# Patient Record
Sex: Male | Born: 1965
Health system: Southern US, Community
[De-identification: ages and names within clinical notes are randomized; demographics above are authoritative.]

## PROBLEM LIST (undated history)

## (undated) DIAGNOSIS — R001 Bradycardia, unspecified: Secondary | ICD-10-CM

## (undated) HISTORY — PX: TONSILLECTOMY: SUR1361

---

## 2017-02-08 ENCOUNTER — Ambulatory Visit (HOSPITAL_COMMUNITY)
Admission: EM | Admit: 2017-02-08 | Discharge: 2017-02-09 | Disposition: A | Discharge status: Home / Self Care | Payer: BLUE CROSS/BLUE SHIELD | Attending: Internal Medicine | Admitting: Internal Medicine

## 2017-02-08 ENCOUNTER — Emergency Department (HOSPITAL_COMMUNITY): Payer: BLUE CROSS/BLUE SHIELD

## 2017-02-08 ENCOUNTER — Emergency Department (HOSPITAL_BASED_OUTPATIENT_CLINIC_OR_DEPARTMENT_OTHER): Payer: BLUE CROSS/BLUE SHIELD

## 2017-02-08 ENCOUNTER — Encounter (HOSPITAL_COMMUNITY): Payer: Self-pay | Admitting: Emergency Medicine

## 2017-02-08 ENCOUNTER — Encounter (HOSPITAL_COMMUNITY)
Admission: EM | Disposition: A | Discharge status: Home / Self Care | Payer: Self-pay | Source: Home / Self Care | Attending: Emergency Medicine

## 2017-02-08 DIAGNOSIS — R32 Unspecified urinary incontinence: Secondary | ICD-10-CM | POA: Insufficient documentation

## 2017-02-08 DIAGNOSIS — R55 Syncope and collapse: Secondary | ICD-10-CM

## 2017-02-08 DIAGNOSIS — I495 Sick sinus syndrome: Secondary | ICD-10-CM | POA: Diagnosis present

## 2017-02-08 DIAGNOSIS — R251 Tremor, unspecified: Secondary | ICD-10-CM | POA: Insufficient documentation

## 2017-02-08 DIAGNOSIS — Z959 Presence of cardiac and vascular implant and graft, unspecified: Secondary | ICD-10-CM

## 2017-02-08 DIAGNOSIS — Z23 Encounter for immunization: Secondary | ICD-10-CM | POA: Diagnosis not present

## 2017-02-08 DIAGNOSIS — R001 Bradycardia, unspecified: Secondary | ICD-10-CM | POA: Diagnosis not present

## 2017-02-08 HISTORY — DX: Bradycardia, unspecified: R00.1

## 2017-02-08 HISTORY — PX: INSERT / REPLACE / REMOVE PACEMAKER: SUR710

## 2017-02-08 HISTORY — PX: PACEMAKER IMPLANT: EP1218

## 2017-02-08 LAB — ECHOCARDIOGRAM COMPLETE
Ao-asc: 31 cm
CHL CUP TV REG PEAK VELOCITY: 205 cm/s
E decel time: 236 msec
EERAT: 6.99
FS: 28 % (ref 28–44)
Height: 69.5 in
IVS/LV PW RATIO, ED: 0.89
LA ID, A-P, ES: 35 mm
LA diam end sys: 35 mm
LA diam index: 1.74 cm/m2
LA vol index: 21.2 mL/m2
LAVOL: 42.7 mL
LAVOLA4C: 39.2 mL
LDCA: 3.8 cm2
LV E/e'average: 6.99
LV PW d: 11.3 mm — AB (ref 0.6–1.1)
LV e' LATERAL: 9.57 cm/s
LVEEMED: 6.99
LVOT diameter: 22 mm
MV Dec: 236
MVPKAVEL: 60.6 m/s
MVPKEVEL: 66.9 m/s
RV LATERAL S' VELOCITY: 20.4 cm/s
RV TAPSE: 21.2 mm
RV sys press: 20 mmHg
TDI e' lateral: 9.57
TDI e' medial: 9.46
TRMAXVEL: 205 cm/s
WEIGHTICAEL: 2880 [oz_av]

## 2017-02-08 LAB — BASIC METABOLIC PANEL
Anion gap: 9 (ref 5–15)
BUN: 8 mg/dL (ref 6–20)
CHLORIDE: 101 mmol/L (ref 101–111)
CO2: 25 mmol/L (ref 22–32)
Calcium: 8.8 mg/dL — ABNORMAL LOW (ref 8.9–10.3)
Creatinine, Ser: 0.85 mg/dL (ref 0.61–1.24)
GFR calc Af Amer: >60 mL/min (ref >60)
GFR calc non Af Amer: >60 mL/min (ref >60)
GLUCOSE: 106 mg/dL — AB (ref 65–99)
POTASSIUM: 4.5 mmol/L (ref 3.5–5.1)
SODIUM: 135 mmol/L (ref 135–145)

## 2017-02-08 LAB — SURGICAL PCR SCREEN
MRSA, PCR: NEGATIVE
STAPHYLOCOCCUS AUREUS: POSITIVE — AB

## 2017-02-08 LAB — URINALYSIS, ROUTINE W REFLEX MICROSCOPIC
Bilirubin Urine: NEGATIVE
GLUCOSE, UA: NEGATIVE mg/dL
Hgb urine dipstick: NEGATIVE
KETONES UR: NEGATIVE mg/dL
LEUKOCYTES UA: NEGATIVE
Nitrite: NEGATIVE
PH: 7 (ref 5.0–8.0)
Protein, ur: NEGATIVE mg/dL
SPECIFIC GRAVITY, URINE: 1.011 (ref 1.005–1.030)

## 2017-02-08 LAB — CBC
HEMATOCRIT: 45.4 % (ref 39.0–52.0)
HEMOGLOBIN: 14.8 g/dL (ref 13.0–17.0)
MCH: 30.8 pg (ref 26.0–34.0)
MCHC: 32.6 g/dL (ref 30.0–36.0)
MCV: 94.6 fL (ref 78.0–100.0)
Platelets: 305 10*3/uL (ref 150–400)
RBC: 4.8 MIL/uL (ref 4.22–5.81)
RDW: 13.4 % (ref 11.5–15.5)
WBC: 10.1 10*3/uL (ref 4.0–10.5)

## 2017-02-08 LAB — CBG MONITORING, ED
Glucose-Capillary: 100 mg/dL — ABNORMAL HIGH (ref 65–99)
Glucose-Capillary: 115 mg/dL — ABNORMAL HIGH (ref 65–99)

## 2017-02-08 SURGERY — PACEMAKER IMPLANT

## 2017-02-08 MED ORDER — INFLUENZA VAC SPLIT QUAD 0.5 ML IM SUSY
0.5000 mL | PREFILLED_SYRINGE | INTRAMUSCULAR | Status: AC
  Administered 2017-02-09: 0.5 mL via INTRAMUSCULAR
  Filled 2017-02-08: qty 0.5

## 2017-02-08 MED ORDER — BUPIVACAINE HCL (PF) 0.25 % IJ SOLN
INTRAMUSCULAR | Status: AC
  Filled 2017-02-08: qty 30

## 2017-02-08 MED ORDER — CHLORHEXIDINE GLUCONATE 4 % EX LIQD
60.0000 mL | Freq: Once | CUTANEOUS | Status: DC
  Filled 2017-02-08: qty 60

## 2017-02-08 MED ORDER — MUPIROCIN 2 % EX OINT
1.0000 "application " | TOPICAL_OINTMENT | Freq: Two times a day (BID) | CUTANEOUS | Status: DC
  Administered 2017-02-09: 1 via NASAL
  Filled 2017-02-08: qty 22

## 2017-02-08 MED ORDER — CEFAZOLIN SODIUM-DEXTROSE 1-4 GM/50ML-% IV SOLN
1.0000 g | Freq: Four times a day (QID) | INTRAVENOUS | Status: AC
  Administered 2017-02-08 – 2017-02-09 (×3): 1 g via INTRAVENOUS
  Filled 2017-02-08 (×3): qty 50

## 2017-02-08 MED ORDER — BUPIVACAINE HCL (PF) 0.25 % IJ SOLN
INTRAMUSCULAR | Status: DC | PRN
  Administered 2017-02-08: 75 mL

## 2017-02-08 MED ORDER — SODIUM CHLORIDE 0.9% FLUSH
3.0000 mL | Freq: Two times a day (BID) | INTRAVENOUS | Status: DC
  Administered 2017-02-08 – 2017-02-09 (×2): 3 mL via INTRAVENOUS

## 2017-02-08 MED ORDER — BUPIVACAINE HCL (PF) 0.25 % IJ SOLN
INTRAMUSCULAR | Status: AC
  Filled 2017-02-08: qty 60

## 2017-02-08 MED ORDER — HYDROCODONE-ACETAMINOPHEN 5-325 MG PO TABS: 1.0000 | ORAL_TABLET | ORAL | Status: DC | PRN

## 2017-02-08 MED ORDER — SODIUM CHLORIDE 0.9 % IV SOLN: 250.0000 mL | INTRAVENOUS | Status: DC | PRN

## 2017-02-08 MED ORDER — SODIUM CHLORIDE 0.9% FLUSH: 3.0000 mL | INTRAVENOUS | Status: DC | PRN

## 2017-02-08 MED ORDER — SODIUM CHLORIDE 0.9 % IR SOLN
Status: AC
  Filled 2017-02-08: qty 2

## 2017-02-08 MED ORDER — HEPARIN (PORCINE) IN NACL 2-0.9 UNIT/ML-% IJ SOLN
INTRAMUSCULAR | Status: AC
  Filled 2017-02-08: qty 500

## 2017-02-08 MED ORDER — HEPARIN (PORCINE) IN NACL 2-0.9 UNIT/ML-% IJ SOLN
INTRAMUSCULAR | Status: AC | PRN
  Administered 2017-02-08: 500 mL

## 2017-02-08 MED ORDER — ONDANSETRON HCL 4 MG/2ML IJ SOLN: 4.0000 mg | Freq: Four times a day (QID) | INTRAMUSCULAR | Status: DC | PRN

## 2017-02-08 MED ORDER — ACETAMINOPHEN 325 MG PO TABS
325.0000 mg | ORAL_TABLET | ORAL | Status: DC | PRN
  Administered 2017-02-09: 04:00:00 650 mg via ORAL
  Filled 2017-02-08: qty 2

## 2017-02-08 MED ORDER — SODIUM CHLORIDE 0.9 % IV SOLN: INTRAVENOUS | Status: DC

## 2017-02-08 MED ORDER — IOPAMIDOL (ISOVUE-370) INJECTION 76%
INTRAVENOUS | Status: AC
  Filled 2017-02-08: qty 50

## 2017-02-08 MED ORDER — CEFAZOLIN SODIUM-DEXTROSE 2-4 GM/100ML-% IV SOLN
2.0000 g | INTRAVENOUS | Status: AC
  Administered 2017-02-08: 2 g via INTRAVENOUS
  Filled 2017-02-08: qty 100

## 2017-02-08 MED ORDER — YOU HAVE A PACEMAKER BOOK
Freq: Once | Status: AC
  Administered 2017-02-08: 18:00:00
  Filled 2017-02-08: qty 1

## 2017-02-08 MED ORDER — CEFAZOLIN SODIUM-DEXTROSE 2-4 GM/100ML-% IV SOLN
INTRAVENOUS | Status: AC
  Filled 2017-02-08: qty 100

## 2017-02-08 MED ORDER — FENTANYL CITRATE (PF) 100 MCG/2ML IJ SOLN
INTRAMUSCULAR | Status: DC | PRN
  Administered 2017-02-08: 25 ug via INTRAVENOUS
  Administered 2017-02-08: 12.5 ug via INTRAVENOUS

## 2017-02-08 MED ORDER — FENTANYL CITRATE (PF) 100 MCG/2ML IJ SOLN
INTRAMUSCULAR | Status: AC
  Filled 2017-02-08: qty 2

## 2017-02-08 MED ORDER — IOPAMIDOL (ISOVUE-370) INJECTION 76%
INTRAVENOUS | Status: DC | PRN
  Administered 2017-02-08: 15 mL via INTRAVENOUS

## 2017-02-08 MED ORDER — MIDAZOLAM HCL 5 MG/5ML IJ SOLN
INTRAMUSCULAR | Status: AC
  Filled 2017-02-08: qty 5

## 2017-02-08 MED ORDER — CHLORHEXIDINE GLUCONATE CLOTH 2 % EX PADS
6.0000 | MEDICATED_PAD | Freq: Every day | CUTANEOUS | Status: DC
  Administered 2017-02-09: 10:00:00 6 via TOPICAL

## 2017-02-08 MED ORDER — MIDAZOLAM HCL 5 MG/5ML IJ SOLN
INTRAMUSCULAR | Status: DC | PRN
  Administered 2017-02-08 (×2): 1 mg via INTRAVENOUS
  Administered 2017-02-08: 2 mg via INTRAVENOUS

## 2017-02-08 MED ORDER — SODIUM CHLORIDE 0.9 % IR SOLN
80.0000 mg | Status: AC
  Administered 2017-02-08: 80 mg
  Filled 2017-02-08: qty 2

## 2017-02-08 SURGICAL SUPPLY — 8 items
CABLE SURGICAL S-101-97-12 (CABLE) ×2 IMPLANT
IPG PACE AZUR XT DR MRI W1DR01 (Pacemaker) ×1 IMPLANT
LEAD CAPSURE NOVUS 45CM (Lead) ×2 IMPLANT
LEAD CAPSURE NOVUS 5076-58CM (Lead) ×2 IMPLANT
PACE AZURE XT DR MRI W1DR01 (Pacemaker) ×2 IMPLANT
PAD DEFIB LIFELINK (PAD) ×2 IMPLANT
SHEATH CLASSIC 7F (SHEATH) ×4 IMPLANT
TRAY PACEMAKER INSERTION (PACKS) ×2 IMPLANT

## 2017-02-08 NOTE — ED Notes (Signed)
Wife of patient ran out of room and got this RN states "he passed out again". RN entered room, patient was unconscious, pale, diaphoretic, EKG- sinus brady with rate in the 30's, pulses weak with noticable tremors in bilateral upper arms and episode of urinary incontinence- episode lasted approximately 30 seconds-1 minute. Pt alert and oriented to everything but current syncope/seizure episode immediately after episode. Pt denies pain sts "I feel weak and woozy."

## 2017-02-08 NOTE — Progress Notes (Signed)
  Echocardiogram 2D Echocardiogram has been performed.  Daniel Saunders 02/08/2017, 3:04 PM

## 2017-02-08 NOTE — ED Notes (Signed)
Patient transported to X-ray 

## 2017-02-08 NOTE — Discharge Summary (Signed)
ELECTROPHYSIOLOGY PROCEDURE DISCHARGE SUMMARY    Patient ID: Daniel Saunders,  MRN: [id number], DOB/AGE: [birth year] 51 y.o.  Admit date: 02/08/2017 Discharge date: 02/09/2017  Primary Care Physician: Patient, No Pcp Per Electrophysiologist: Souleymane Saiki  Primary Discharge Diagnosis:  Symptomatic bradycardia status post pacemaker implantation this admission  No Known Allergies   Procedures This Admission:  1.  Implantation of a MDT dual chamber PPM on 02/08/17 by Dr Johney FrameAllred.  The patient received a MDT model number Azure PPM with model number 5076 right atrial lead and 5076 right ventricular lead. There were no immediate post procedure complications. 2.  CXR on 02/09/17 demonstrated no pneumothorax status post device implantation.   Brief HPI/Hospital Course:  Daniel ManlyScott Cryderman is a 51 y.o. male who developed syncope without prodrome the day of admission. He came to the ER where he had another syncopal spell that corresponded with a 7.2 second sinus arrest on telemetry.  Echocardiogram demonstrated normal LV function. The patient has had symptomatic bradycardia without reversible causes identified.  Risks, benefits, and alternatives to PPM implantation were reviewed with the patient who wished to proceed. The patient was admitted and underwent implantation of a MDT dual chamber PPM with details as outlined above. Left chest was without hematoma or ecchymosis.  The device was interrogated and found to be functioning normally.  CXR was obtained and demonstrated no pneumothorax status post device implantation.  Wound care, arm mobility, and restrictions were reviewed with the patient.  The patient was examined and considered stable for discharge to home.    Physical Exam: Vitals:   02/08/17 1936 02/08/17 2000 02/08/17 2100 02/09/17 0355  BP:  (!) 106/51 120/62 119/84  Pulse:  94 (!) 101 72  Resp:  14 17 19   Temp: 98.4 F (36.9 C)   98.6 F (37 C)  TempSrc: Oral   Oral  SpO2: 99% 95% 95%  95%  Weight:      Height:        GEN- The patient is well appearing, alert and oriented x 3 today.   HEENT: normocephalic, atraumatic; sclera clear, conjunctiva pink; hearing intact; oropharynx clear; neck supple, no JVP Lymph- no cervical lymphadenopathy Lungs- Clear to ausculation bilaterally, normal work of breathing.  No wheezes, rales, rhonchi Heart- Regular rate and rhythm, no murmurs, rubs or gallops GI- soft, non-tender, non-distended, bowel sounds present  Extremities- no clubbing, cyanosis, or edema; DP/PT/radial pulses 2+ bilaterally MS- no significant deformity or atrophy Skin- warm and dry, no rash or lesion, left chest without hematoma/ecchymosis Psych- euthymic mood, full affect Neuro- strength and sensation are intact   Labs:   Lab Results  Component Value Date   WBC 10.1 02/08/2017   HGB 14.8 02/08/2017   HCT 45.4 02/08/2017   MCV 94.6 02/08/2017   PLT 305 02/08/2017     Recent Labs Lab 02/08/17 0933  NA 135  K 4.5  CL 101  CO2 25  BUN 8  CREATININE 0.85  CALCIUM 8.8*  GLUCOSE 106*    Discharge Medications:  Allergies as of 02/09/2017   No Known Allergies     Medication List    You have not been prescribed any medications.     Disposition:   Follow-up Information    Hillis RangeAllred, Lamira Borin, MD Follow up on 05/11/2017.   Specialty:  Cardiology Why:  at 9:15AM Contact information: [address] [place] [address] [phone]        Marily LenteSeiler, Amber K, NP Follow up on 02/15/2017.  Specialty:  Cardiology Why:  at 12:40PM Contact information: [address] [place] [address] [phone]           Duration of Discharge Encounter: Greater than 30 minutes including physician time.  Randolm Idol MD 02/09/2017 7:19 AM

## 2017-02-08 NOTE — ED Triage Notes (Signed)
Pt reports witnessed synope at work, lasting 15-20 seconds, denies CP, SOB, weakness. Pt denies falls, head trauma.  AOx4 at this time., NAD noted. Resp e/u.

## 2017-02-08 NOTE — ED Notes (Signed)
Cards at bedside

## 2017-02-08 NOTE — Consult Note (Signed)
Consult Note   Daniel Saunders ZOX:[id number] DOB: 15-Jun-1965 DOA: 02/08/2017  PCP: Patient, No Pcp Per Patient coming from: home  Chief Complaint: syncope   Requesting Physician: Dr Adriana Simas - EDP  HPI: Daniel Saunders is a 51 y.o. male with medical history significant of tonsillectomy.   Patient presents to the ED after single syncopal episode. This occurred shortly prior to presentation while at work. Patient states that he had just finished walking up a flight of stairs and was talking to a coworker when he passed out. Episode lasted approximately 15-20 seconds before coming back around. Denies any presyncopal lightheadedness, chest pain, palpitations, shortness of breath or cough, nausea, dizziness/vertigo. Denies any post syncopal confusion, focal neurological deficit, or any other symptom. During syncopal episode patient was simply unresponsive there is no report of loss of bowel or bladder function or seizure type activity. States he simply felt a little anxious after the episode occurred. In the ED patient had a second syncopal episode. This occurred while patient was talking to his wife. During episode in the emergency room patient did lose his bladder function. Episode bus partially 30-60 seconds. At time of syncopal episode patient was noted to be bradycardic with greater than 7 seconds pons in his heart rate which returned spontaneously.  Patient drinks 312 ounce diet Mountain Dew's per day with no recent change in his caffeine intake. Does not drink coffee. Drinks 1-2 light beers per day. Does not smoke.  Sleeps 6-8.5hrs daily.   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   ED Course: Place on telemetry. Objective findings outlined below. Bradycardia with 7 second positive heart rate noted on telemetry. Cardiology consultation.  Review of Systems: As per HPI otherwise all other systems reviewed and are negative  Ambulatory Status:no  restrictions.  Past Medical History:  Diagnosis Date  . Bradycardia     Past Surgical History:  Procedure Laterality Date  . TONSILLECTOMY      Social History   Social History  . Marital status: Married    Spouse name: N/A  . Number of children: N/A  . Years of education: N/A   Occupational History  . Not on file.   Social History Main Topics  . Smoking status: Never Smoker  . Smokeless tobacco: Not on file  . Alcohol use Yes  . Drug use: No  . Sexual activity: Not on file   Other Topics Concern  . Not on file   Social History Narrative  . No narrative on file    No Known Allergies  Family History  Problem Relation Age of Onset  . Arrhythmia Maternal Grandmother   . Diabetes Mother       Prior to Admission medications   Not on File    Physical Exam: Vitals:   02/08/17 1230 02/08/17 1300 02/08/17 1330 02/08/17 1400  BP: 131/82 126/87 (!) 130/94 125/89  Pulse: 90 71 71 85  Resp: 15 15 13 15   SpO2: 95% 100% 100% 99%  Weight:      Height:         General:  Appears calm and comfortable Eyes:  PERRL, EOMI, normal lids, iris ENT:  grossly normal hearing, lips & tongue, mmm Neck:  no LAD, masses or thyromegaly Cardiovascular:  RRR, no m/r/g. No LE edema.  Respiratory:  CTA bilaterally, no w/r/r. Normal respiratory effort. Abdomen:  soft, ntnd, NABS Skin:  no rash or induration seen on limited exam Musculoskeletal:  grossly normal tone  BUE/BLE, good ROM, no bony abnormality Psychiatric:  grossly normal mood and affect, speech fluent and appropriate, AOx3 Neurologic:  CN 2-12 grossly intact, moves all extremities in coordinated fashion, sensation intact  Labs on Admission: I have personally reviewed following labs and imaging studies  CBC:  Recent Labs Lab 02/08/17 0933  WBC 10.1  HGB 14.8  HCT 45.4  MCV 94.6  PLT 305   Basic Metabolic Panel:  Recent Labs Lab 02/08/17 0933  NA 135  K 4.5  CL 101  CO2 25  GLUCOSE 106*  BUN 8    CREATININE 0.85  CALCIUM 8.8*   GFR: Estimated Creatinine Clearance: 102.2 mL/min (by C-G formula based on SCr of 0.85 mg/dL). Liver Function Tests: No results for input(s): AST, ALT, ALKPHOS, BILITOT, PROT, ALBUMIN in the last 168 hours. No results for input(s): LIPASE, AMYLASE in the last 168 hours. No results for input(s): AMMONIA in the last 168 hours. Coagulation Profile: No results for input(s): INR, PROTIME in the last 168 hours. Cardiac Enzymes: No results for input(s): CKTOTAL, CKMB, CKMBINDEX, TROPONINI in the last 168 hours. BNP (last 3 results) No results for input(s): PROBNP in the last 8760 hours. HbA1C: No results for input(s): HGBA1C in the last 72 hours. CBG:  Recent Labs Lab 02/08/17 0950 02/08/17 1005  GLUCAP 100* 115*   Lipid Profile: No results for input(s): CHOL, HDL, LDLCALC, TRIG, CHOLHDL, LDLDIRECT in the last 72 hours. Thyroid Function Tests: No results for input(s): TSH, T4TOTAL, FREET4, T3FREE, THYROIDAB in the last 72 hours. Anemia Panel: No results for input(s): VITAMINB12, FOLATE, FERRITIN, TIBC, IRON, RETICCTPCT in the last 72 hours. Urine analysis:    Component Value Date/Time   COLORURINE STRAW (A) 02/08/2017 1245   APPEARANCEUR CLEAR 02/08/2017 1245   LABSPEC 1.011 02/08/2017 1245   PHURINE 7.0 02/08/2017 1245   GLUCOSEU NEGATIVE 02/08/2017 1245   HGBUR NEGATIVE 02/08/2017 1245   BILIRUBINUR NEGATIVE 02/08/2017 1245   KETONESUR NEGATIVE 02/08/2017 1245   PROTEINUR NEGATIVE 02/08/2017 1245   NITRITE NEGATIVE 02/08/2017 1245   LEUKOCYTESUR NEGATIVE 02/08/2017 1245    Creatinine Clearance: Estimated Creatinine Clearance: 102.2 mL/min (by C-G formula based on SCr of 0.85 mg/dL).  Sepsis Labs: @LABRCNTIP (procalcitonin:4,lacticidven:4) )No results found for this or any previous visit (from the past 240 hour(s)).   Radiological Exams on Admission: Dg Chest 2 View  Result Date: 02/08/2017 CLINICAL DATA:  To re- syncopal episodes  this morning without history of same. Patient was found have sinus bradycardia with a rate in the 30s. Patient did have urinary incontinence in there were tremors in the upper extremities. EXAM: CHEST  2 VIEW COMPARISON:  None in PACs FINDINGS: The lungs are adequately inflated and clear. The heart and pulmonary vascularity are normal. The mediastinum is normal in width. There is no pleural effusion. The bony thorax exhibits no acute abnormality. IMPRESSION: There is no active cardiopulmonary disease. Electronically Signed   By: Gurbani Figge  [place] M.D.   On: 02/08/2017 11:39     EKG: Independently reviewed. NSR, brady. No ACS  Assessment/Plan Active Problems:   Bradycardia   Syncope, cardiogenic   Syncope: Cardiac is the likely etiology given witnessed episode w/ tele monitoring showing bradycardia and 7 second pause. No sign of infection, metabolic derangement, intracranial process, seizure, or other etiology. No change in caffeine intake recently (3, 12oz mtn dew daily). Maternal grandmother w/ pacemaker in her later years. No palpitations, CP, SOB, LE swelling. Past cardiac or other contributing medical problems.  - Cardiology following w/  EP team onboard. Likely pacemaker candidate.  - Tele, cycle trop - f/u Echo results  General medical: pt otherwise healthy though does not go to the doctor.  - Lipid panel - A1c (mother w/ DM) - outpt referral for PCP - discussed options for pt to investigate and he will make calls to establish care - outpt referral for screening colonoscopy - Pt to call GI to set up  TRH will sign off at this time. Please feel free to re-consult as needed.   Ozella Rocksavid J Venora Kautzman MD [facility]  If 7PM-7AM, please contact night-coverage [email or web address] Password [email or web address]  02/08/2017, 3:37 PM

## 2017-02-08 NOTE — ED Provider Notes (Signed)
[facility] Provider Note   CSN: [id number] Arrival date & time: 02/08/17  [id number]     History   Chief Complaint Chief Complaint  Patient presents with  . Loss of Consciousness    HPI Sandria ManlyScott Lordi is a 51 y.o. male.  Level V caveat for urgent need for intervention. Patient had a syncopal episode at work today lasting 15-20 seconds as he slumped to the floor.    No previous history of syncope.  Patient had an additional episode in the emergency department where he "passed out" for approximately 30-60 seconds. He was pale and diaphoretic at that time. EKG revealed bradycardia with rate in the 30s.  This has never happened before. He claims no cardiac history. No medications. Nonsmoker.        History reviewed. No pertinent past medical history.  There are no active problems to display for this patient.   History reviewed. No pertinent surgical history.     Home Medications    Prior to Admission medications   Not on File    Family History No family history on file.  Social History Social History  Substance Use Topics  . Smoking status: Not on file  . Smokeless tobacco: Not on file  . Alcohol use Not on file     Allergies   Patient has no known allergies.   Review of Systems Review of Systems  Unable to perform ROS: Acuity of condition     Physical Exam Updated Vital Signs BP 121/83   Pulse 74   Resp 15   Ht 5' 9.5" (1.765 m)   Wt 81.6 kg (180 lb)   SpO2 100%   BMI 26.20 kg/m   Physical Exam  Constitutional: He is oriented to person, place, and time. He appears well-developed and well-nourished.  HENT:  Head: Normocephalic and atraumatic.  Eyes: Conjunctivae are normal.  Neck: Neck supple.  Cardiovascular: Normal rate and regular rhythm.   Pulmonary/Chest: Effort normal and breath sounds normal.  Abdominal: Soft. Bowel sounds are normal.  Musculoskeletal: Normal range of motion.  Neurological: He  is alert and oriented to person, place, and time.  Skin: Skin is warm and dry.  Psychiatric: He has a normal mood and affect. His behavior is normal.  Nursing note and vitals reviewed.    ED Treatments / Results  Labs (all labs ordered are listed, but only abnormal results are displayed) Labs Reviewed  BASIC METABOLIC PANEL - Abnormal; Notable for the following:       Result Value   Glucose, Bld 106 (*)    Calcium 8.8 (*)    All other components within normal limits  CBG MONITORING, ED - Abnormal; Notable for the following:    Glucose-Capillary 100 (*)    All other components within normal limits  CBG MONITORING, ED - Abnormal; Notable for the following:    Glucose-Capillary 115 (*)    All other components within normal limits  CBC  URINALYSIS, ROUTINE W REFLEX MICROSCOPIC    EKG  EKG Interpretation  Date/Time:  Thursday February 08 2017 08:55:42 EDT Ventricular Rate:  89 PR Interval:  136 QRS Duration: 84 QT Interval:  358 QTC Calculation: 435 R Axis:   55 Text Interpretation:  Normal sinus rhythm with sinus arrhythmia Normal ECG Confirmed by Donnetta Hutchingook, Matrice Herro ([id number]) on 02/08/2017 10:53:44 AM       Radiology Dg Chest 2 View  Result Date: 02/08/2017 CLINICAL DATA:  To re- syncopal episodes this morning without history  of same. Patient was found have sinus bradycardia with a rate in the 30s. Patient did have urinary incontinence in there were tremors in the upper extremities. EXAM: CHEST  2 VIEW COMPARISON:  None in PACs FINDINGS: The lungs are adequately inflated and clear. The heart and pulmonary vascularity are normal. The mediastinum is normal in width. There is no pleural effusion. The bony thorax exhibits no acute abnormality. IMPRESSION: There is no active cardiopulmonary disease. Electronically Signed   By: David  [place] M.D.   On: 02/08/2017 11:39    Procedures Procedures (including critical care time)  Medications Ordered in ED Medications - No data to  display   Initial Impression / Assessment and Plan / ED Course  I have reviewed the triage vital signs and the nursing notes.  Pertinent labs & imaging results that were available during my care of the patient were reviewed by me and considered in my medical decision making (see chart for details).     RN reported bradycardia during patient's syncopal event in the ED. Will consult cardiology and admit to general medicine.  Final Clinical Impressions(s) / ED Diagnoses   Final diagnoses:  Syncope, unspecified syncope type    New Prescriptions New Prescriptions   No medications on file     Donnetta Hutching, MD 02/08/17 1156

## 2017-02-08 NOTE — Interval H&P Note (Signed)
History and Physical Interval Note:  02/08/2017 3:43 PM  Lorin PicketScott Rica MastSchomburg  has presented today for surgery, with the diagnosis of bradycardia  The various methods of treatment have been discussed with the patient and family. After consideration of risks, benefits and other options for treatment, the patient has consented to  Procedure(s): PACEMAKER IMPLANT (N/A) as a surgical intervention .  The patient's history has been reviewed, patient examined, no change in status, stable for surgery.  I have reviewed the patient's chart and labs.  Questions were answered to the patient's satisfaction.     Hillis RangeJames Porter Moes

## 2017-02-08 NOTE — Care Management Note (Signed)
Case Management Note  Patient Details  Name: Sandria ManlyScott Burrough MRN: [id number] Date of Birth: 1966/03/14  Subjective/Objective:   From home with spouse, pta indep, s/p Visual merchandiserpace maker.   He does not have a PCP, but wife states she will check with her PCP to see if she can get a follow up apt for him to be a patient with .  Wife will give NCM this information tomorrow.               Action/Plan: NCM will follow for dc needs.   Expected Discharge Date:                  Expected Discharge Plan:  Home/Self Care  In-House Referral:     Discharge planning Services  CM Consult  Post Acute Care Choice:    Choice offered to:     DME Arranged:    DME Agency:     HH Arranged:    HH Agency:     Status of Service:  Completed, signed off  If discussed at [employer] Length of Stay Meetings, dates discussed:    Additional Comments:  Leone Havenaylor, Nallely Yost Clinton, RN 02/08/2017, 5:26 PM

## 2017-02-08 NOTE — H&P (Signed)
History & Physical    Patient ID: Daniel Saunders MRN: [id number], DOB/AGE: October 13, 1965   Admit date: 02/08/2017   Primary Physician: Patient, No Pcp Per Primary Cardiologist: New   Patient Profile    51 yo male with no PMH who presented with syncope.   Past Medical History    History reviewed. No pertinent past medical history.  History reviewed. No pertinent surgical history.   Allergies  No Known Allergies  History of Present Illness    Mr. Daniel Saunders is a 51 yo male with no PMH. 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 Denies any family history of CAD, but grandmother did have a pacemaker. States he is fairly active, never have any anginal symptoms or dyspnea on exertion or at rest. Was in his usual state of health today while at work. Reports he was talking with someone about the HVAC system in the office and walked up a flight of stairs. Was talking with a co-worker when he had a witnessed syncopal episode from a standing position that reportedly lasted about 15-20 seconds. When he came to he was asymptomatic. Presented to the ED with symptoms.   In the ED his labs showed stable electrolytes, Hgb 14.8. EKG showed SR with no acute ST/T wave changes. UA negative. CXR negative. While in the ED he was talking with his wife, and had another syncopal episode. Wife reports he was talking and suddenly became unresponsive, head dropped to his side and was incontinent. Episode reportedly lasted about 30-60 seconds. In review of telemetry he was bradycardic, with pauses of >7 seconds that recovered spontaneously. He denies any Rx medications, OTC medications, tobacco or drug use. Does drink occasionally.   Home Medications    Prior to Admission medications   Not on File  none  Family History    Family History  Problem Relation Age of Onset  . Arrhythmia Maternal Grandmother     Social History    Social History   Social History  .  Marital status: Married    Spouse name: N/A  . Number of children: N/A  . Years of education: N/A   Occupational History  . Not on file.   Social History Main Topics  . Smoking status: Never Smoker  . Smokeless tobacco: Not on file  . Alcohol use Yes  . Drug use: No  . Sexual activity: Not on file   Other Topics Concern  . Not on file   Social History Narrative  . 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  Physical Exam    Blood pressure 125/89, pulse 85, resp. rate 15, height 5' 9.5" (1.765 m), weight 180 lb (81.6 kg), SpO2 99 %.  General: Pleasant, NAD Psych: Normal affect. Neuro: Alert and oriented X 3. Moves all extremities spontaneously. HEENT: Normal  Neck: Supple without bruits or JVD. Lungs:  Resp regular and unlabored, CTA. Heart: RRR no s3, s4, or murmurs. Abdomen: Soft, non-tender, non-distended, BS + x 4.  Extremities: No clubbing, cyanosis or edema. DP/PT/Radials 2+ and equal bilaterally.  Labs    Troponin (Point of Care Test) No results for input(s): TROPIPOC in the last 72 hours. No results for input(s): CKTOTAL, CKMB, TROPONINI in the last 72 hours. Lab Results  Component Value Date   WBC 10.1 02/08/2017   HGB 14.8 02/08/2017  HCT 45.4 02/08/2017   MCV 94.6 02/08/2017   PLT 305 02/08/2017     Recent Labs Lab 02/08/17 0933  NA 135  K 4.5  CL 101  CO2 25  BUN 8  CREATININE 0.85  CALCIUM 8.8*  GLUCOSE 106*   No results found for: CHOL, HDL, LDLCALC, TRIG No results found for: [facility]   Radiology Studies    Dg Chest 2 View  Result Date: 02/08/2017 CLINICAL DATA:  To re- syncopal episodes this morning without history of same. Patient was found have sinus bradycardia with a rate in the 30s. Patient did have urinary incontinence in there were tremors in the upper extremities. EXAM: CHEST  2 VIEW COMPARISON:  None in PACs FINDINGS: The  lungs are adequately inflated and clear. The heart and pulmonary vascularity are normal. The mediastinum is normal in width. There is no pleural effusion. The bony thorax exhibits no acute abnormality. IMPRESSION: There is no active cardiopulmonary disease. Electronically Signed   By: David  [place] M.D.   On: 02/08/2017 11:39    ECG & Cardiac Imaging    EKG: SR  Assessment & Plan    51 yo male with no PMH who presented with syncope.  Syncope: has had 2 witnessed syncopal episodes today, one at work while standing, and one in the ED. Review of telemetry during these episodes showed SB with pauses of >7 seconds with spontaneously recovery. Was incontinent with episode in the ED. No identified reversible causes in talking with patient. Do not suspect cardiac cath is indicated at this time as he has been completely asymptomatic prior to today, and no family Hx of CAD.  -- stat echo -- Dr. Johney Frame to see to determine further therapy, and possible PPM placement.  -- hold NPO for now  Signed, Laverda Page, NP-C Pager [phone] 02/08/2017, 2:05 PM   I have seen, examined the patient, and reviewed the above assessment and plan.  Changes to above are made where necessary.  On exam, RRR.  Pt with abrupt syncope x 2.  On telemetry, he has had documented sinus pauses of up to 7 seconds as the cause.  He takes no medicines and has no symptoms.  He specifically denies vagal symptoms or ischemic symptoms.  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  Echo is normal. The patient has symptomatic bradycardia with syncope.  I would therefore recommend pacemaker implantation at this time.  Risks, benefits, alternatives to pacemaker implantation were discussed in detail with the patient today. The patient understands that the risks include but are not limited to bleeding, infection, pneumothorax, perforation, tamponade, vascular damage, renal failure, MI, stroke, death,  and  lead dislodgement and wishes to proceed at this time.  Hillis Range MD, [facility] 02/08/2017 3:33 PM

## 2017-02-08 NOTE — Progress Notes (Signed)
Orthopedic Tech Progress Note Patient Details:  Daniel Saunders Feb 09, 1966 [id number]  Ortho Devices Ortho Device/Splint Location: pt has arm sling at this time.    Alvina ChouWilliams, Saloni Lablanc C 02/08/2017, 5:52 PM

## 2017-02-09 ENCOUNTER — Encounter: Payer: Self-pay | Admitting: *Deleted

## 2017-02-09 ENCOUNTER — Ambulatory Visit (HOSPITAL_COMMUNITY): Payer: BLUE CROSS/BLUE SHIELD

## 2017-02-09 ENCOUNTER — Encounter (HOSPITAL_COMMUNITY): Payer: Self-pay | Admitting: Internal Medicine

## 2017-02-09 DIAGNOSIS — Z23 Encounter for immunization: Secondary | ICD-10-CM | POA: Diagnosis not present

## 2017-02-09 DIAGNOSIS — R251 Tremor, unspecified: Secondary | ICD-10-CM | POA: Diagnosis not present

## 2017-02-09 DIAGNOSIS — Z006 Encounter for examination for normal comparison and control in clinical research program: Secondary | ICD-10-CM

## 2017-02-09 DIAGNOSIS — R55 Syncope and collapse: Secondary | ICD-10-CM | POA: Diagnosis not present

## 2017-02-09 DIAGNOSIS — Z4682 Encounter for fitting and adjustment of non-vascular catheter: Secondary | ICD-10-CM | POA: Diagnosis not present

## 2017-02-09 DIAGNOSIS — I495 Sick sinus syndrome: Secondary | ICD-10-CM | POA: Diagnosis not present

## 2017-02-09 DIAGNOSIS — R32 Unspecified urinary incontinence: Secondary | ICD-10-CM | POA: Diagnosis not present

## 2017-02-09 LAB — BASIC METABOLIC PANEL
Anion gap: 10 (ref 5–15)
BUN: 8 mg/dL (ref 6–20)
CALCIUM: 8.8 mg/dL — AB (ref 8.9–10.3)
CO2: 25 mmol/L (ref 22–32)
CREATININE: 0.73 mg/dL (ref 0.61–1.24)
Chloride: 102 mmol/L (ref 101–111)
GFR calc non Af Amer: >60 mL/min (ref >60)
Glucose, Bld: 100 mg/dL — ABNORMAL HIGH (ref 65–99)
Potassium: 3.6 mmol/L (ref 3.5–5.1)
SODIUM: 137 mmol/L (ref 135–145)

## 2017-02-09 MED FILL — Gentamicin Sulfate Inj 40 MG/ML: INTRAMUSCULAR | Qty: 2 | Status: AC

## 2017-02-09 MED FILL — Sodium Chloride Irrigation Soln 0.9%: Qty: 500 | Status: AC

## 2017-02-09 NOTE — Progress Notes (Signed)
Research Encounter  Daniel Saunders met criteria for the [facility] evaluation. The project was discussed with the patient including the risk and benefits. Mr. Dehaas was given the opportunity to read the consent and ask questions. Mr. Rebstock signed the consent, a copy was given to the patient, and a copy was placed in his chart. After the consent was signed, we proceeded with the evaluation.

## 2017-02-09 NOTE — Discharge Instructions (Signed)
° ° °  Supplemental Discharge Instructions for  Pacemaker  Patients  Activity No heavy lifting or vigorous activity with your left/right arm for 6 to 8 weeks.  Do not raise your left/right arm above your head for one week.  Gradually raise your affected arm as drawn below.           __    02/12/17                02/13/17                   02/14/17                 02/15/17  NO DRIVING for 1 week    ; you may begin driving on  16/01/9609/25/18   .  WOUND CARE - Keep the wound area clean and dry.  Do not get this area wet for one week. No showers for one week; you may shower on  02/15/17   . - The tape/steri-strips on your wound will fall off; do not pull them off.  No bandage is needed on the site.  DO  NOT apply any creams, oils, or ointments to the wound area. - If you notice any drainage or discharge from the wound, any swelling or bruising at the site, or you develop a fever > 101? F after you are discharged home, call the office at once.  Special Instructions - You are still able to use cellular telephones; use the ear opposite the side where you have your pacemaker.  Avoid carrying your cellular phone near your device. - When traveling through airports, show security personnel your identification card to avoid being screened in the metal detectors.  Ask the security personnel to use the hand wand. - Avoid arc welding equipment, TENS units (transcutaneous nerve stimulators).  Call the office for questions about other devices. - Avoid electrical appliances that are in poor condition or are not properly grounded. - Microwave ovens are safe to be near or to operate.

## 2017-02-09 NOTE — Progress Notes (Signed)
   Progress Note  Patient Name: Daniel Saunders Date of Encounter: 02/09/2017    Subjective   Doing well today, the patient denies CP or SOB.  No new concerns  Inpatient Medications    Scheduled Meds: . Chlorhexidine Gluconate Cloth  6 each Topical Daily  . Influenza vac split quadrivalent PF  0.5 mL Intramuscular Tomorrow-1000  . mupirocin ointment  1 application Nasal BID  . sodium chloride flush  3 mL Intravenous Q12H   Continuous Infusions: . sodium chloride Stopped (02/09/17 0520)  .  ceFAZolin (ANCEF) IV Stopped (02/09/17 0422)   PRN Meds: sodium chloride, acetaminophen, HYDROcodone-acetaminophen, ondansetron (ZOFRAN) IV, sodium chloride flush   Vital Signs    Vitals:   02/08/17 1936 02/08/17 2000 02/08/17 2100 02/09/17 0355  BP:  (!) 106/51 120/62 119/84  Pulse:  94 (!) 101 72  Resp:  14 17 19   Temp: 98.4 F (36.9 C)   98.6 F (37 C)  TempSrc: Oral   Oral  SpO2: 99% 95% 95% 95%  Weight:      Height:        Intake/Output Summary (Last 24 hours) at 02/09/17 0715 Last data filed at 02/09/17 0400  Gross per 24 hour  Intake              603 ml  Output                0 ml  Net              603 ml   Filed Weights   02/08/17 0948  Weight: 180 lb (81.6 kg)    Telemetry    Sinus rhythm - Personally Reviewed  Physical Exam   GEN- The patient is well appearing, alert and oriented x 3 today.   Head- normocephalic, atraumatic Eyes-  Sclera clear, conjunctiva pink Ears- hearing intact Oropharynx- clear Neck- supple, Lungs- Clear to ausculation bilaterally, normal work of breathing Heart- Regular rate and rhythm  GI- soft, NT, ND, + BS Extremities- no clubbing, cyanosis, or edema, MS- no significant deformity or atrophy Skin- no rash or lesion,  No pacemaker pocket hematoma Psych- euthymic mood, full affect Neuro- strength and sensation are intact   Labs    Chemistry Recent Labs Lab 02/08/17 0933  NA 135  K 4.5  CL 101  CO2 25  GLUCOSE 106*    BUN 8  CREATININE 0.85  CALCIUM 8.8*  GFRNONAA >60  GFRAA >60  ANIONGAP 9     Hematology Recent Labs Lab 02/08/17 0933  WBC 10.1  RBC 4.80  HGB 14.8  HCT 45.4  MCV 94.6  MCH 30.8  MCHC 32.6  RDW 13.4  PLT 305    Cardiac EnzymesNo results for input(s): TROPONINI in the last 168 hours. No results for input(s): TROPIPOC in the last 168 hours.     Assessment & Plan    1. Sick sinus syndrome with syncope Normal pacemaker function Enrolled in BluSync trial for remote monitoring Wound care discussed No driving until after wound check with EP NP Will obtain outpatient myoview at time of follow-up with EP NP.  DC to home.  Hillis RangeJames Fredrick Dray MD, [facility] 02/09/2017 7:15 AM

## 2017-02-14 ENCOUNTER — Telehealth: Payer: Self-pay | Admitting: Nurse Practitioner

## 2017-02-14 ENCOUNTER — Telehealth: Payer: Self-pay | Admitting: *Deleted

## 2017-02-14 NOTE — Progress Notes (Signed)
Electrophysiology Office Note Date: 02/15/2017  ID:  Daniel Saunders, DOB 18-Nov-1965, MRN [id number]  PCP: Patient, No Pcp Per Electrophysiologist: Allred  CC: Pacemaker follow-up  Daniel Saunders is a 51 y.o. male seen today for Dr Johney Frame.  He presents today for routine electrophysiology followup.  Since last being seen in our clinic, the patient reports doing very well.  He denies chest pain, palpitations, dyspnea, PND, orthopnea, nausea, vomiting, dizziness, syncope, edema, weight gain, or early satiety.  Device History: MDT dual chamber PPM implanted 2018 for intermittent sinus arrest   Past Medical History:  Diagnosis Date  . Symptomatic bradycardia 02/08/2017   a. s/p MDT dual chamber PPM    Past Surgical History:  Procedure Laterality Date  . INSERT / REPLACE / REMOVE PACEMAKER  02/08/2017  . PACEMAKER IMPLANT N/A 02/08/2017   Procedure: PACEMAKER IMPLANT;  Surgeon: Hillis Range, MD;  Location: [facility] INV[facility];  Service: Cardiovascular;  Laterality: N/A;  . TONSILLECTOMY      Current Outpatient Prescriptions  Medication Sig Dispense Refill  . acetaminophen (TYLENOL) 325 MG tablet Take 650 mg by mouth as needed.     No current facility-administered medications for this visit.     Allergies:   Patient has no known allergies.   Social History: Social History   Social History  . Marital status: Married    Spouse name: N/A  . Number of children: N/A  . Years of education: N/A   Occupational History  . Not on file.   Social History Main Topics  . Smoking status: Never Smoker  . Smokeless tobacco: Never Used  . Alcohol use 1.2 oz/week    2 Cans of beer per week  . Drug use: No  . Sexual activity: Yes   Other Topics Concern  . Not on file   Social History Narrative  . No narrative on file    Family History: Family History  Problem Relation Age of Onset  . Arrhythmia Maternal Grandmother   . Diabetes Mother      Review of Systems: All  other systems reviewed and are otherwise negative except as noted above.   Physical Exam: VS:  BP 122/90   Pulse 81   Ht 5' 9.5" (1.765 m)   Wt 189 lb 9.6 oz (86 kg)   SpO2 97%   BMI 27.60 kg/m  , BMI Body mass index is 27.6 kg/m.  GEN- The patient is well appearing, alert and oriented x 3 today.   HEENT: normocephalic, atraumatic; sclera clear, conjunctiva pink; hearing intact; oropharynx clear; neck supple  Lungs- Clear to ausculation bilaterally, normal work of breathing.  No wheezes, rales, rhonchi Heart- Regular rate and rhythm, no murmurs, rubs or gallops  GI- soft, non-tender, non-distended, bowel sounds present  Extremities- no clubbing, cyanosis, or edema  MS- no significant deformity or atrophy Skin- warm and dry, no rash or lesion; PPM pocket well healed, some opening near the medial portion of incision. Steri-strips reapplied. Psych- euthymic mood, full affect Neuro- strength and sensation are intact  PPM Interrogation- reviewed in detail today,  See PACEART report  EKG:  EKG is not ordered today.  Recent Labs: 02/08/2017: Hemoglobin 14.8; Platelets 305 02/09/2017: BUN 8; Creatinine, Ser 0.73; Potassium 3.6; Sodium 137   Wt Readings from Last 3 Encounters:  02/15/17 189 lb 9.6 oz (86 kg)  02/08/17 180 lb (81.6 kg)     Other studies Reviewed: Additional studies/ records that were reviewed today include: hospital records   Assessment  and Plan:  1.  Symptomatic bradycardia Normal PPM function See Pace Art report No changes today Wound well healed with the exception of very medial portion of incision. Steri-strips reapplied. Advised do not shower for a few more days. Remove steri-strips in 1 week.  Instructions reviewed Follow up in BlueSync study as scheduled   Current medicines are reviewed at length with the patient today.   The patient does not have concerns regarding his medicines.  The following changes were made today:  none  Labs/ tests ordered  today include: none Orders Placed This Encounter  Procedures  . CUP PACEART INCLINIC DEVICE CHECK     Disposition:   Follow up with Dr Johney FrameAllred 3 months      Signed, Gypsy BalsamAmber Maresha Anastos, NP 02/15/2017 11:15 AM  [facility] [address] [place] [address] [phone] (office) [phone] (fax)

## 2017-02-14 NOTE — Telephone Encounter (Signed)
New Message  Pt call requesting to speak with RN. Pt states he was instructed not to take a shower until the 25th due to his wound. Pt would like to know if he would need to take a shower before or after his appt for 10/25. Please call back to discuss

## 2017-02-14 NOTE — Telephone Encounter (Signed)
SPOKE TO PT AND PT AWARE UNLESS HAVE AN HAND HELD SHOWER HEAD TO CONTROL THE WOUND SITE TO GET WET  TAKE ONLY A SPONGE BATH WASH UP UNTIL AFTER OV 10-25.Marland Kitchen. PER WOUND /DEVICE TEAM NURSE

## 2017-02-15 ENCOUNTER — Ambulatory Visit (INDEPENDENT_AMBULATORY_CARE_PROVIDER_SITE_OTHER): Payer: BLUE CROSS/BLUE SHIELD | Admitting: Nurse Practitioner

## 2017-02-15 ENCOUNTER — Encounter: Payer: Self-pay | Admitting: Nurse Practitioner

## 2017-02-15 VITALS — BP 122/90 | HR 81 | Ht 69.5 in | Wt 189.6 lb

## 2017-02-15 DIAGNOSIS — I495 Sick sinus syndrome: Secondary | ICD-10-CM

## 2017-02-15 LAB — CUP PACEART INCLINIC DEVICE CHECK
Implantable Lead Implant Date: 20181018
Implantable Lead Location: 753859
Implantable Pulse Generator Implant Date: 20181018
MDC IDC LEAD IMPLANT DT: 20181018
MDC IDC LEAD LOCATION: 753862
MDC IDC SESS DTM: 20181025105322

## 2017-02-15 NOTE — Patient Instructions (Addendum)
Medication Instructions:   Your physician recommends that you continue on your current medications as directed. Please refer to the Current Medication list given to you today.   If you need a refill on your cardiac medications before your next appointment, please call your pharmacy.  Labwork: NONE ORDERED  TODAY    Testing/Procedures: NONE ORDERED  TODAY    Follow-Up: IN 3 MONTHS WITH DR ALLRED    Any Other Special Instructions Will Be Listed Below (If Applicable).                                                                                                                                                   

## 2017-02-22 ENCOUNTER — Ambulatory Visit: Payer: BLUE CROSS/BLUE SHIELD

## 2017-02-27 ENCOUNTER — Telehealth: Payer: Self-pay | Admitting: Cardiology

## 2017-02-27 ENCOUNTER — Ambulatory Visit (INDEPENDENT_AMBULATORY_CARE_PROVIDER_SITE_OTHER): Payer: Self-pay | Admitting: *Deleted

## 2017-02-27 DIAGNOSIS — I495 Sick sinus syndrome: Secondary | ICD-10-CM

## 2017-02-27 NOTE — Telephone Encounter (Signed)
Spoke with pt and reminded pt of remote transmission that is due today. Pt verbalized understanding.   

## 2017-02-28 NOTE — Progress Notes (Signed)
Remote pacemaker transmission.   

## 2017-03-01 ENCOUNTER — Encounter: Payer: Self-pay | Admitting: Cardiology

## 2017-03-01 NOTE — Progress Notes (Signed)
Letter  

## 2017-03-05 LAB — CUP PACEART REMOTE DEVICE CHECK
Battery Remaining Longevity: 182 mo
Battery Voltage: 3.22 V
Brady Statistic AP VS Percent: 6.09 %
Brady Statistic RV Percent Paced: 0.03 %
Implantable Lead Implant Date: 20181018
Implantable Lead Location: 753859
Implantable Lead Model: 5076
Lead Channel Impedance Value: 342 Ohm
Lead Channel Impedance Value: 342 Ohm
Lead Channel Impedance Value: 475 Ohm
Lead Channel Pacing Threshold Amplitude: 0.5 V
Lead Channel Sensing Intrinsic Amplitude: 4.125 mV
Lead Channel Sensing Intrinsic Amplitude: 4.125 mV
Lead Channel Setting Pacing Amplitude: 3.5 V
MDC IDC LEAD IMPLANT DT: 20181018
MDC IDC LEAD LOCATION: 753862
MDC IDC MSMT LEADCHNL RA PACING THRESHOLD PULSEWIDTH: 0.4 ms
MDC IDC MSMT LEADCHNL RV IMPEDANCE VALUE: 437 Ohm
MDC IDC MSMT LEADCHNL RV SENSING INTR AMPL: 10.125 mV
MDC IDC MSMT LEADCHNL RV SENSING INTR AMPL: 10.125 mV
MDC IDC PG IMPLANT DT: 20181018
MDC IDC SESS DTM: 20181106191151
MDC IDC SET LEADCHNL RV PACING AMPLITUDE: 3.5 V
MDC IDC SET LEADCHNL RV PACING PULSEWIDTH: 0.4 ms
MDC IDC SET LEADCHNL RV SENSING SENSITIVITY: 0.9 mV
MDC IDC STAT BRADY AP VP PERCENT: 0.01 %
MDC IDC STAT BRADY AS VP PERCENT: 0.02 %
MDC IDC STAT BRADY AS VS PERCENT: 93.88 %
MDC IDC STAT BRADY RA PERCENT PACED: 6.1 %

## 2017-03-13 ENCOUNTER — Ambulatory Visit (INDEPENDENT_AMBULATORY_CARE_PROVIDER_SITE_OTHER): Payer: Self-pay | Admitting: *Deleted

## 2017-03-13 DIAGNOSIS — I495 Sick sinus syndrome: Secondary | ICD-10-CM

## 2017-03-14 LAB — CUP PACEART REMOTE DEVICE CHECK
Battery Remaining Longevity: 182 mo
Battery Voltage: 3.21 V
Brady Statistic AP VP Percent: 0.01 %
Brady Statistic AP VS Percent: 6.81 %
Brady Statistic RA Percent Paced: 6.82 %
Brady Statistic RV Percent Paced: 0.04 %
Date Time Interrogation Session: 20181120152431
Implantable Lead Implant Date: 20181018
Implantable Lead Location: 753859
Implantable Lead Location: 753862
Implantable Lead Model: 5076
Implantable Pulse Generator Implant Date: 20181018
Lead Channel Impedance Value: 323 Ohm
Lead Channel Impedance Value: 342 Ohm
Lead Channel Impedance Value: 437 Ohm
Lead Channel Pacing Threshold Amplitude: 0.5 V
Lead Channel Pacing Threshold Pulse Width: 0.4 ms
Lead Channel Sensing Intrinsic Amplitude: 3 mV
Lead Channel Setting Pacing Amplitude: 3.5 V
MDC IDC LEAD IMPLANT DT: 20181018
MDC IDC MSMT LEADCHNL RA SENSING INTR AMPL: 3 mV
MDC IDC MSMT LEADCHNL RV IMPEDANCE VALUE: 418 Ohm
MDC IDC MSMT LEADCHNL RV SENSING INTR AMPL: 10.375 mV
MDC IDC MSMT LEADCHNL RV SENSING INTR AMPL: 10.375 mV
MDC IDC SET LEADCHNL RV PACING AMPLITUDE: 3.5 V
MDC IDC SET LEADCHNL RV PACING PULSEWIDTH: 0.4 ms
MDC IDC SET LEADCHNL RV SENSING SENSITIVITY: 0.9 mV
MDC IDC STAT BRADY AS VP PERCENT: 0.02 %
MDC IDC STAT BRADY AS VS PERCENT: 93.16 %

## 2017-03-14 NOTE — Progress Notes (Signed)
Remote pacemaker transmission.   

## 2017-03-22 ENCOUNTER — Encounter: Payer: Self-pay | Admitting: Cardiology

## 2017-05-11 ENCOUNTER — Encounter: Payer: Self-pay | Admitting: Internal Medicine

## 2017-05-11 ENCOUNTER — Ambulatory Visit (INDEPENDENT_AMBULATORY_CARE_PROVIDER_SITE_OTHER): Payer: BLUE CROSS/BLUE SHIELD | Admitting: Internal Medicine

## 2017-05-11 VITALS — BP 136/80 | HR 87 | Ht 69.5 in | Wt 191.2 lb

## 2017-05-11 DIAGNOSIS — I495 Sick sinus syndrome: Secondary | ICD-10-CM

## 2017-05-11 DIAGNOSIS — Z950 Presence of cardiac pacemaker: Secondary | ICD-10-CM

## 2017-05-11 NOTE — Patient Instructions (Signed)
Medication Instructions:  Your physician recommends that you continue on your current medications as directed. Please refer to the Current Medication list given to you today.   Labwork: None ordered   Testing/Procedures: None ordered   Follow-Up: Your physician wants you to follow-up in: 12 months with Dr. Johney FrameAllred.  You will receive a reminder letter in the mail two months in advance. If you don't receive a letter, please call our office to schedule the follow-up appointment.  Remote monitoring is used to monitor your Pacemaker from home. This monitoring reduces the number of office visits required to check your device to one time per year. It allows us to keep an eye on the functioning of your device to ensure it is working properly. You are scheduled for a device check from home on 05/14/17. You may send your transmission at any time that day. If you have a wireless device, the transmission will be sent automatically. After your physician reviews your transmission, you will receive a postcard with your next transmission date.   Any Other Special Instructions Will Be Listed Below (If Applicable).     If you need a refill on your cardiac medications before your next appointment, please call your pharmacy.

## 2017-05-11 NOTE — Progress Notes (Signed)
    PCP: Patient, No Pcp Per   Primary EP:  Dr Johney FrameAllred  Daniel ManlyScott Ballester is a 52 y.o. male who presents today for routine electrophysiology followup.  Since his recent PPM implant, the patient reports doing very well.  Today, he denies symptoms of palpitations, chest pain, shortness of breath,  lower extremity edema, dizziness, presyncope, or syncope.  The patient is otherwise without complaint today.   Past Medical History:  Diagnosis Date  . Symptomatic bradycardia 02/08/2017   a. s/p MDT dual chamber PPM    Past Surgical History:  Procedure Laterality Date  . INSERT / REPLACE / REMOVE PACEMAKER  02/08/2017  . PACEMAKER IMPLANT N/A 02/08/2017   Procedure: PACEMAKER IMPLANT;  Surgeon: Hillis RangeAllred, Kellina Dreese, MD;  Location: [facility];  Service: Cardiovascular;  Laterality: N/A;  . TONSILLECTOMY      ROS- all systems are reviewed and negative except as per HPI above  Current Outpatient Medications  Medication Sig Dispense Refill  . acetaminophen (TYLENOL) 325 MG tablet Take 650 mg by mouth as needed.     No current facility-administered medications for this visit.     Physical Exam: Vitals:   05/11/17 0924  BP: 136/80  Pulse: 87  SpO2: 96%  Weight: 191 lb 4 oz (86.8 kg)  Height: 5' 9.5" (1.765 m)    GEN- The patient is well appearing, alert and oriented x 3 today.   Head- normocephalic, atraumatic Eyes-  Sclera clear, conjunctiva pink Ears- hearing intact Oropharynx- clear Lungs- Clear to ausculation bilaterally, normal work of breathing Chest- pacemaker pocket is well healed Heart- Regular rate and rhythm, no murmurs, rubs or gallops, PMI not laterally displaced GI- soft, NT, ND, + BS Extremities- no clubbing, cyanosis, or edema  Pacemaker interrogation- reviewed in detail today,  See PACEART report   Assessment and Plan:  1. Symptomatic sinus bradycardia  Normal pacemaker function See Pace Art report No changes today  2. Syncope Resolved s/p  PPM  [facility]link Return to see me in a year   Hillis RangeJames Abhinav Mayorquin MD, [facility] 05/11/2017 10:08 AM

## 2017-05-14 ENCOUNTER — Ambulatory Visit (INDEPENDENT_AMBULATORY_CARE_PROVIDER_SITE_OTHER): Payer: Self-pay | Admitting: *Deleted

## 2017-05-14 DIAGNOSIS — I495 Sick sinus syndrome: Secondary | ICD-10-CM

## 2017-05-14 NOTE — Progress Notes (Signed)
Remote pacemaker transmission.   

## 2017-05-15 ENCOUNTER — Encounter: Payer: Self-pay | Admitting: Cardiology

## 2017-05-16 LAB — CUP PACEART REMOTE DEVICE CHECK
Battery Remaining Longevity: 181 mo
Brady Statistic AP VS Percent: 17.38 %
Brady Statistic RV Percent Paced: 0.03 %
Date Time Interrogation Session: 20190121141321
Implantable Lead Implant Date: 20181018
Implantable Lead Implant Date: 20181018
Implantable Lead Location: 753859
Implantable Lead Model: 5076
Implantable Pulse Generator Implant Date: 20181018
Lead Channel Impedance Value: 456 Ohm
Lead Channel Pacing Threshold Pulse Width: 0.4 ms
Lead Channel Sensing Intrinsic Amplitude: 12.625 mV
Lead Channel Sensing Intrinsic Amplitude: 2.75 mV
Lead Channel Setting Pacing Amplitude: 2.5 V
Lead Channel Setting Pacing Pulse Width: 0.4 ms
Lead Channel Setting Sensing Sensitivity: 0.9 mV
MDC IDC LEAD LOCATION: 753860
MDC IDC MSMT BATTERY VOLTAGE: 3.2 V
MDC IDC MSMT LEADCHNL RA IMPEDANCE VALUE: 361 Ohm
MDC IDC MSMT LEADCHNL RA IMPEDANCE VALUE: 456 Ohm
MDC IDC MSMT LEADCHNL RA PACING THRESHOLD AMPLITUDE: 0.375 V
MDC IDC MSMT LEADCHNL RA PACING THRESHOLD PULSEWIDTH: 0.4 ms
MDC IDC MSMT LEADCHNL RA SENSING INTR AMPL: 2.75 mV
MDC IDC MSMT LEADCHNL RV IMPEDANCE VALUE: 323 Ohm
MDC IDC MSMT LEADCHNL RV PACING THRESHOLD AMPLITUDE: 1 V
MDC IDC MSMT LEADCHNL RV SENSING INTR AMPL: 12.625 mV
MDC IDC SET LEADCHNL RA PACING AMPLITUDE: 2 V
MDC IDC STAT BRADY AP VP PERCENT: 0 %
MDC IDC STAT BRADY AS VP PERCENT: 0.02 %
MDC IDC STAT BRADY AS VS PERCENT: 82.59 %
MDC IDC STAT BRADY RA PERCENT PACED: 17.39 %

## 2017-05-17 LAB — CUP PACEART INCLINIC DEVICE CHECK
Battery Remaining Longevity: 181 mo
Battery Voltage: 3.2 V
Brady Statistic AP VP Percent: 0.01 %
Brady Statistic AS VS Percent: 93.84 %
Brady Statistic RA Percent Paced: 6.14 %
Brady Statistic RV Percent Paced: 0.04 %
Date Time Interrogation Session: 20190118143252
Implantable Lead Implant Date: 20181018
Implantable Lead Location: 753860
Implantable Lead Model: 5076
Implantable Pulse Generator Implant Date: 20181018
Lead Channel Impedance Value: 323 Ohm
Lead Channel Impedance Value: 361 Ohm
Lead Channel Impedance Value: 456 Ohm
Lead Channel Pacing Threshold Amplitude: 0.5 V
Lead Channel Pacing Threshold Amplitude: 1 V
Lead Channel Pacing Threshold Pulse Width: 0.4 ms
Lead Channel Sensing Intrinsic Amplitude: 2.5 mV
MDC IDC LEAD IMPLANT DT: 20181018
MDC IDC LEAD LOCATION: 753859
MDC IDC MSMT LEADCHNL RA IMPEDANCE VALUE: 494 Ohm
MDC IDC MSMT LEADCHNL RV PACING THRESHOLD PULSEWIDTH: 0.4 ms
MDC IDC MSMT LEADCHNL RV SENSING INTR AMPL: 12.75 mV
MDC IDC SET LEADCHNL RA PACING AMPLITUDE: 2 V
MDC IDC SET LEADCHNL RV PACING AMPLITUDE: 2.5 V
MDC IDC SET LEADCHNL RV PACING PULSEWIDTH: 0.4 ms
MDC IDC SET LEADCHNL RV SENSING SENSITIVITY: 0.9 mV
MDC IDC STAT BRADY AP VS PERCENT: 6.13 %
MDC IDC STAT BRADY AS VP PERCENT: 0.02 %

## 2017-05-23 ENCOUNTER — Encounter: Payer: BLUE CROSS/BLUE SHIELD | Admitting: Internal Medicine

## 2017-08-13 ENCOUNTER — Ambulatory Visit (INDEPENDENT_AMBULATORY_CARE_PROVIDER_SITE_OTHER): Payer: BLUE CROSS/BLUE SHIELD | Admitting: *Deleted

## 2017-08-13 DIAGNOSIS — I495 Sick sinus syndrome: Secondary | ICD-10-CM

## 2017-08-13 NOTE — Progress Notes (Signed)
Remote pacemaker transmission.   

## 2017-08-14 ENCOUNTER — Encounter: Payer: Self-pay | Admitting: Cardiology

## 2017-08-14 LAB — CUP PACEART REMOTE DEVICE CHECK
Battery Voltage: 3.17 V
Brady Statistic AP VP Percent: 0.01 %
Brady Statistic AS VP Percent: 0.02 %
Brady Statistic RA Percent Paced: 3 %
Implantable Lead Implant Date: 20181018
Implantable Lead Location: 753860
Implantable Lead Model: 5076
Implantable Lead Model: 5076
Implantable Pulse Generator Implant Date: 20181018
Lead Channel Impedance Value: 323 Ohm
Lead Channel Impedance Value: 456 Ohm
Lead Channel Pacing Threshold Amplitude: 0.5 V
Lead Channel Pacing Threshold Pulse Width: 0.4 ms
Lead Channel Sensing Intrinsic Amplitude: 10.875 mV
Lead Channel Sensing Intrinsic Amplitude: 3.5 mV
Lead Channel Setting Pacing Amplitude: 2 V
Lead Channel Setting Pacing Pulse Width: 0.4 ms
Lead Channel Setting Sensing Sensitivity: 0.9 mV
MDC IDC LEAD IMPLANT DT: 20181018
MDC IDC LEAD LOCATION: 753859
MDC IDC MSMT BATTERY REMAINING LONGEVITY: 178 mo
MDC IDC MSMT LEADCHNL RA IMPEDANCE VALUE: 361 Ohm
MDC IDC MSMT LEADCHNL RA IMPEDANCE VALUE: 437 Ohm
MDC IDC MSMT LEADCHNL RA SENSING INTR AMPL: 3.5 mV
MDC IDC MSMT LEADCHNL RV PACING THRESHOLD AMPLITUDE: 1 V
MDC IDC MSMT LEADCHNL RV PACING THRESHOLD PULSEWIDTH: 0.4 ms
MDC IDC MSMT LEADCHNL RV SENSING INTR AMPL: 10.875 mV
MDC IDC SESS DTM: 20190422020500
MDC IDC SET LEADCHNL RV PACING AMPLITUDE: 2.5 V
MDC IDC STAT BRADY AP VS PERCENT: 2.99 %
MDC IDC STAT BRADY AS VS PERCENT: 96.98 %
MDC IDC STAT BRADY RV PERCENT PACED: 0.03 %

## 2017-11-12 ENCOUNTER — Ambulatory Visit (INDEPENDENT_AMBULATORY_CARE_PROVIDER_SITE_OTHER): Payer: BLUE CROSS/BLUE SHIELD | Admitting: *Deleted

## 2017-11-12 DIAGNOSIS — I495 Sick sinus syndrome: Secondary | ICD-10-CM

## 2017-11-12 NOTE — Progress Notes (Signed)
Remote pacemaker transmission.   

## 2017-11-13 ENCOUNTER — Encounter: Payer: Self-pay | Admitting: Cardiology

## 2017-12-07 LAB — CUP PACEART REMOTE DEVICE CHECK
Battery Remaining Longevity: 175 mo
Battery Voltage: 3.14 V
Brady Statistic RA Percent Paced: 7.58 %
Brady Statistic RV Percent Paced: 0.04 %
Date Time Interrogation Session: 20190722005416
Implantable Lead Implant Date: 20181018
Implantable Lead Implant Date: 20181018
Implantable Lead Location: 753859
Implantable Lead Model: 5076
Implantable Pulse Generator Implant Date: 20181018
Lead Channel Impedance Value: 456 Ohm
Lead Channel Pacing Threshold Amplitude: 0.5 V
Lead Channel Pacing Threshold Pulse Width: 0.4 ms
Lead Channel Sensing Intrinsic Amplitude: 3 mV
Lead Channel Setting Pacing Amplitude: 2.5 V
Lead Channel Setting Sensing Sensitivity: 0.9 mV
MDC IDC LEAD LOCATION: 753860
MDC IDC MSMT LEADCHNL RA IMPEDANCE VALUE: 380 Ohm
MDC IDC MSMT LEADCHNL RA IMPEDANCE VALUE: 456 Ohm
MDC IDC MSMT LEADCHNL RA SENSING INTR AMPL: 3 mV
MDC IDC MSMT LEADCHNL RV IMPEDANCE VALUE: 323 Ohm
MDC IDC MSMT LEADCHNL RV PACING THRESHOLD AMPLITUDE: 1 V
MDC IDC MSMT LEADCHNL RV PACING THRESHOLD PULSEWIDTH: 0.4 ms
MDC IDC MSMT LEADCHNL RV SENSING INTR AMPL: 5.5 mV
MDC IDC MSMT LEADCHNL RV SENSING INTR AMPL: 5.5 mV
MDC IDC SET LEADCHNL RA PACING AMPLITUDE: 2 V
MDC IDC SET LEADCHNL RV PACING PULSEWIDTH: 0.4 ms
MDC IDC STAT BRADY AP VP PERCENT: 0.02 %
MDC IDC STAT BRADY AP VS PERCENT: 7.57 %
MDC IDC STAT BRADY AS VP PERCENT: 0.03 %
MDC IDC STAT BRADY AS VS PERCENT: 92.39 %

## 2018-02-11 ENCOUNTER — Ambulatory Visit (INDEPENDENT_AMBULATORY_CARE_PROVIDER_SITE_OTHER): Payer: BLUE CROSS/BLUE SHIELD | Admitting: *Deleted

## 2018-02-11 DIAGNOSIS — I495 Sick sinus syndrome: Secondary | ICD-10-CM

## 2018-02-11 NOTE — Progress Notes (Signed)
Remote pacemaker transmission.   

## 2018-02-12 ENCOUNTER — Encounter: Payer: Self-pay | Admitting: Cardiology

## 2018-02-12 NOTE — Progress Notes (Signed)
Letter  

## 2018-02-20 ENCOUNTER — Telehealth: Payer: Self-pay

## 2018-02-20 NOTE — Telephone Encounter (Signed)
Called patient to complete annual Blue Sync survey.  

## 2018-03-05 LAB — CUP PACEART REMOTE DEVICE CHECK
Brady Statistic AP VP Percent: 0.02 %
Brady Statistic AP VS Percent: 7.4 %
Brady Statistic AS VP Percent: 0.03 %
Brady Statistic AS VS Percent: 92.56 %
Brady Statistic RV Percent Paced: 0.05 %
Date Time Interrogation Session: 20191021025624
Implantable Lead Implant Date: 20181018
Implantable Lead Implant Date: 20181018
Implantable Lead Model: 5076
Implantable Pulse Generator Implant Date: 20181018
Lead Channel Impedance Value: 304 Ohm
Lead Channel Impedance Value: 361 Ohm
Lead Channel Impedance Value: 437 Ohm
Lead Channel Impedance Value: 513 Ohm
Lead Channel Pacing Threshold Amplitude: 1.125 V
Lead Channel Pacing Threshold Pulse Width: 0.4 ms
Lead Channel Sensing Intrinsic Amplitude: 3 mV
Lead Channel Sensing Intrinsic Amplitude: 8.125 mV
Lead Channel Sensing Intrinsic Amplitude: 8.125 mV
Lead Channel Setting Pacing Amplitude: 2 V
Lead Channel Setting Pacing Amplitude: 2.5 V
Lead Channel Setting Pacing Pulse Width: 0.4 ms
Lead Channel Setting Sensing Sensitivity: 0.9 mV
MDC IDC LEAD LOCATION: 753859
MDC IDC LEAD LOCATION: 753860
MDC IDC MSMT BATTERY REMAINING LONGEVITY: 172 mo
MDC IDC MSMT BATTERY VOLTAGE: 3.11 V
MDC IDC MSMT LEADCHNL RA PACING THRESHOLD AMPLITUDE: 0.5 V
MDC IDC MSMT LEADCHNL RA PACING THRESHOLD PULSEWIDTH: 0.4 ms
MDC IDC MSMT LEADCHNL RA SENSING INTR AMPL: 3 mV
MDC IDC STAT BRADY RA PERCENT PACED: 7.42 %

## 2018-03-06 IMAGING — DX DG CHEST 2V
2 series · 2 of 2 positions shown · non-contrast
Comparison: 02/08/2017

CLINICAL DATA: Post pacer

EXAM:
CHEST  2 VIEW

[chest lat]
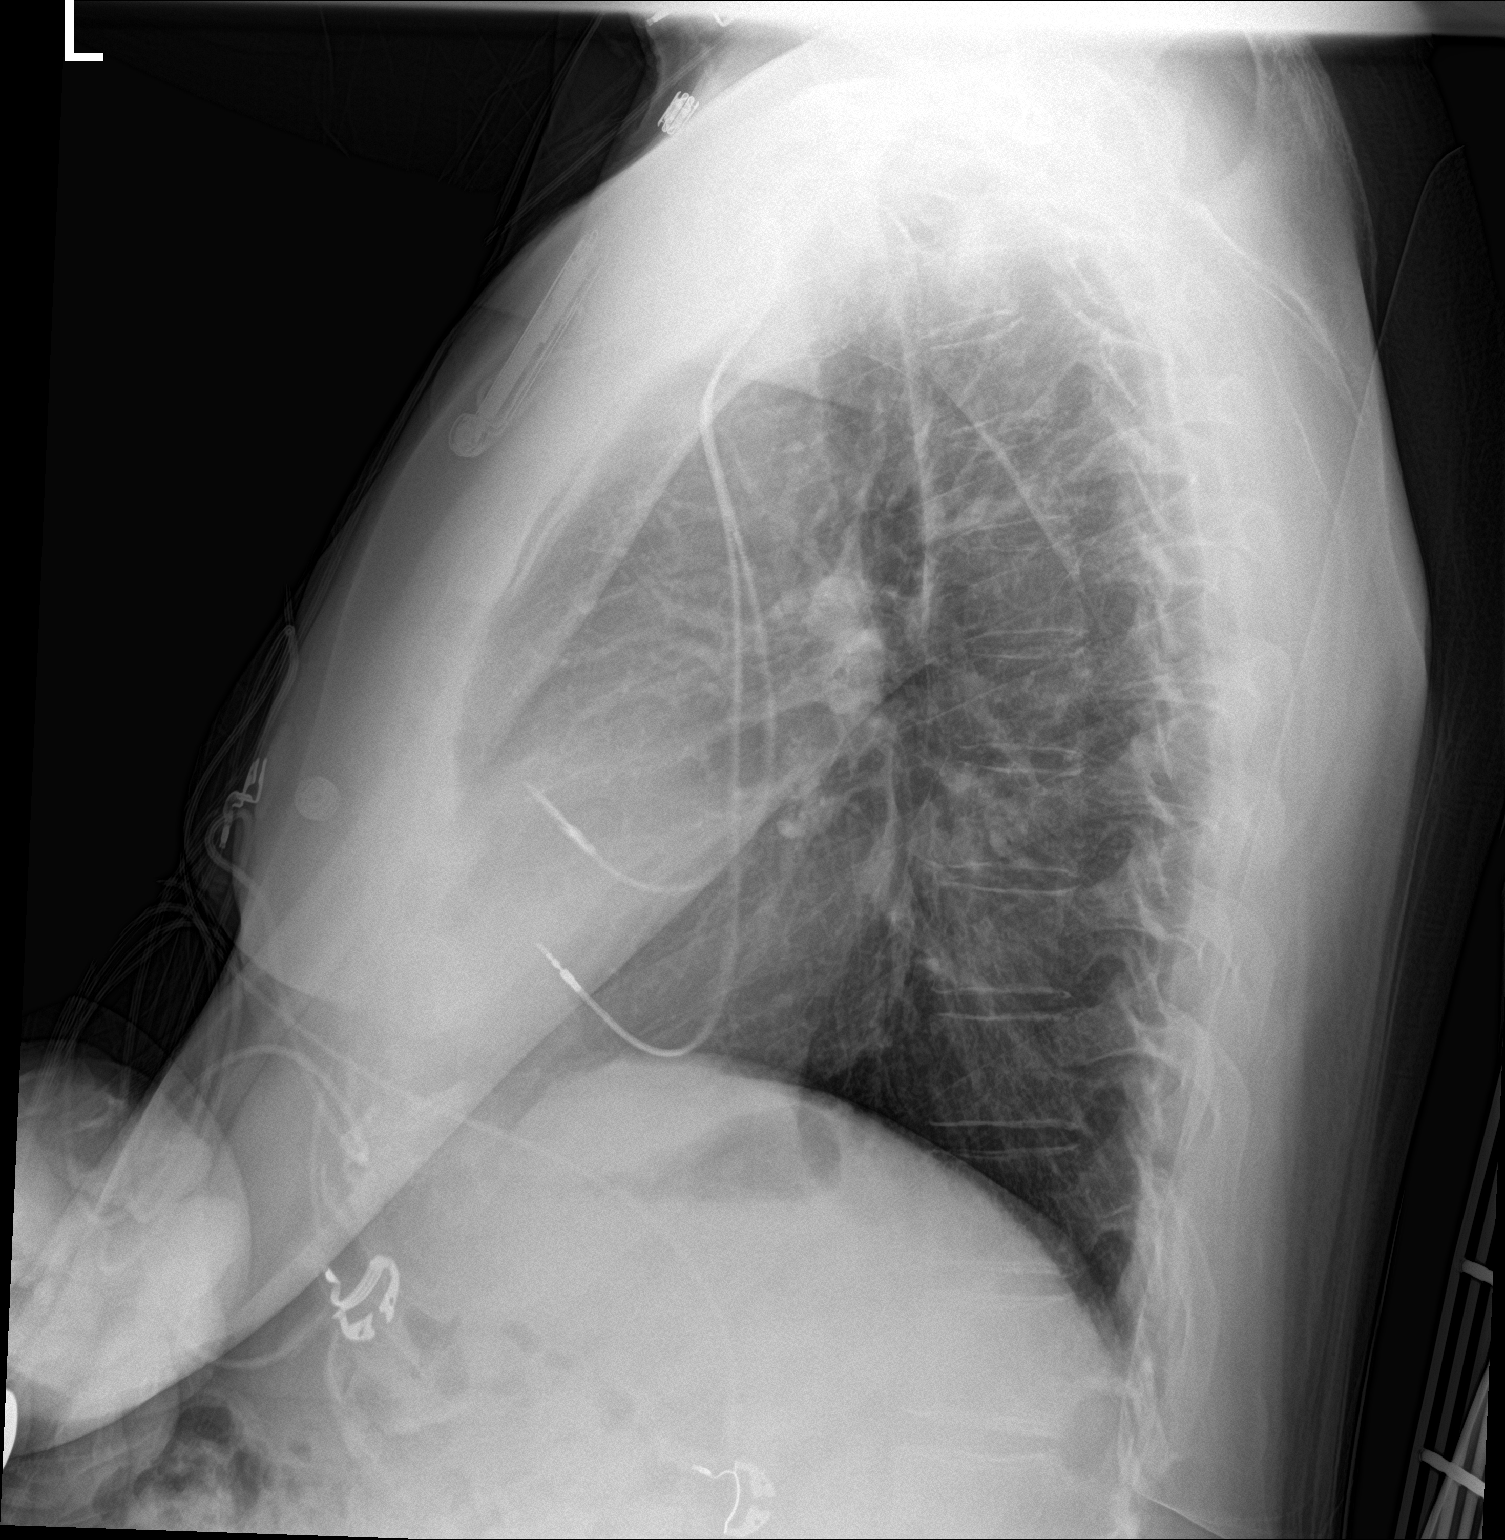

[chest ap]
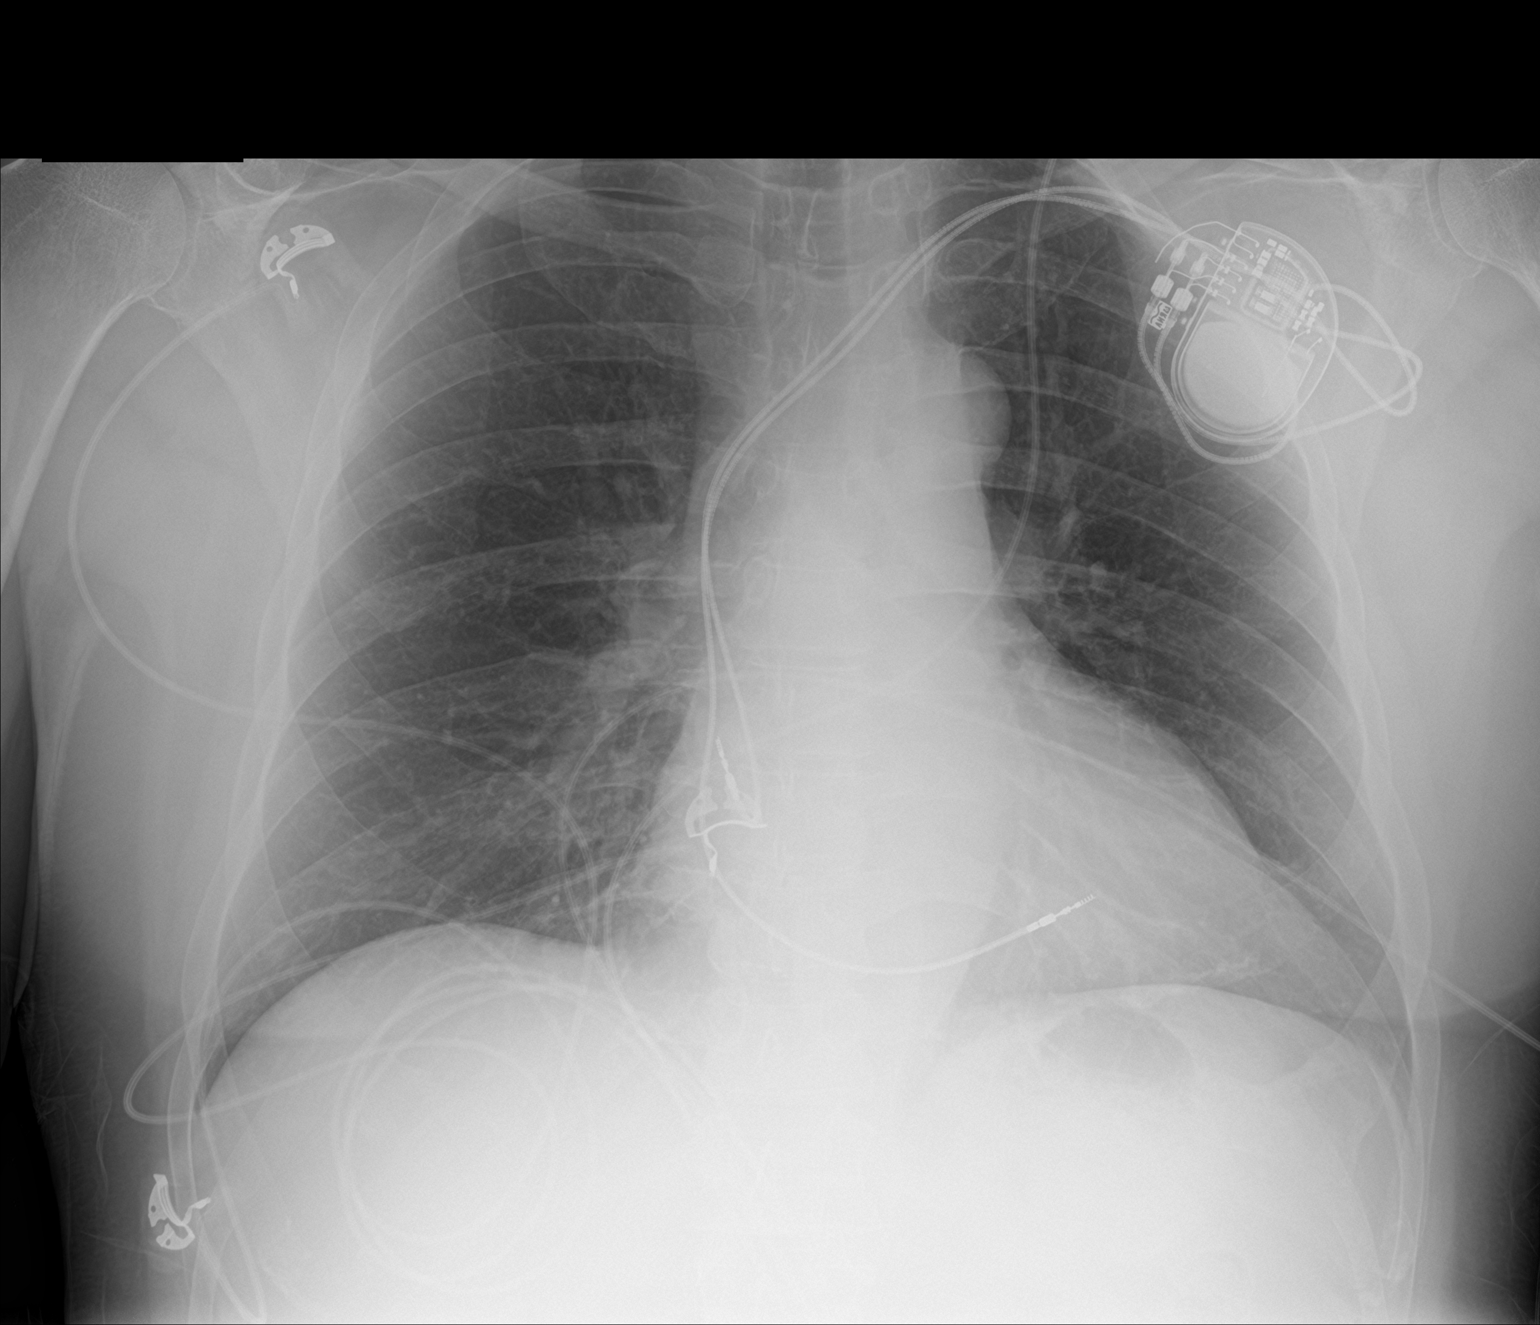

[2 of 2 positions shown; findings below may reference images not displayed]

FINDINGS: Left pacer has been placed with leads in the right atrium and right
ventricle. No pneumothorax. Heart and mediastinal contours are
within normal limits. No focal opacities or effusions. No acute bony
abnormality.
IMPRESSION: Left pacer placement without pneumothorax.

## 2018-04-26 ENCOUNTER — Encounter: Payer: Self-pay | Admitting: Internal Medicine

## 2018-05-17 ENCOUNTER — Telehealth: Payer: Self-pay

## 2018-05-17 ENCOUNTER — Ambulatory Visit (INDEPENDENT_AMBULATORY_CARE_PROVIDER_SITE_OTHER): Payer: BLUE CROSS/BLUE SHIELD

## 2018-05-17 DIAGNOSIS — I495 Sick sinus syndrome: Secondary | ICD-10-CM

## 2018-05-17 NOTE — Telephone Encounter (Signed)
Left message for patient to remind of missed remote transmission.  

## 2018-05-18 LAB — CUP PACEART REMOTE DEVICE CHECK
Battery Remaining Longevity: 169 mo
Battery Voltage: 3.08 V
Brady Statistic AP VS Percent: 3.88 %
Brady Statistic AS VP Percent: 0.02 %
Brady Statistic AS VS Percent: 96.09 %
Date Time Interrogation Session: 20200124185344
Implantable Lead Implant Date: 20181018
Implantable Lead Model: 5076
Implantable Lead Model: 5076
Implantable Pulse Generator Implant Date: 20181018
Lead Channel Impedance Value: 361 Ohm
Lead Channel Impedance Value: 513 Ohm
Lead Channel Pacing Threshold Amplitude: 1.125 V
Lead Channel Sensing Intrinsic Amplitude: 2.625 mV
Lead Channel Sensing Intrinsic Amplitude: 2.625 mV
Lead Channel Sensing Intrinsic Amplitude: 7.875 mV
Lead Channel Sensing Intrinsic Amplitude: 7.875 mV
Lead Channel Setting Pacing Amplitude: 2.5 V
Lead Channel Setting Pacing Pulse Width: 0.4 ms
MDC IDC LEAD IMPLANT DT: 20181018
MDC IDC LEAD LOCATION: 753859
MDC IDC LEAD LOCATION: 753860
MDC IDC MSMT LEADCHNL RA PACING THRESHOLD AMPLITUDE: 0.5 V
MDC IDC MSMT LEADCHNL RA PACING THRESHOLD PULSEWIDTH: 0.4 ms
MDC IDC MSMT LEADCHNL RV IMPEDANCE VALUE: 323 Ohm
MDC IDC MSMT LEADCHNL RV IMPEDANCE VALUE: 437 Ohm
MDC IDC MSMT LEADCHNL RV PACING THRESHOLD PULSEWIDTH: 0.4 ms
MDC IDC SET LEADCHNL RA PACING AMPLITUDE: 2 V
MDC IDC SET LEADCHNL RV SENSING SENSITIVITY: 0.9 mV
MDC IDC STAT BRADY AP VP PERCENT: 0.01 %
MDC IDC STAT BRADY RA PERCENT PACED: 3.89 %
MDC IDC STAT BRADY RV PERCENT PACED: 0.03 %

## 2018-05-20 NOTE — Progress Notes (Signed)
Remote pacemaker transmission.   

## 2018-05-22 ENCOUNTER — Encounter: Payer: BLUE CROSS/BLUE SHIELD | Admitting: Internal Medicine

## 2018-05-27 ENCOUNTER — Ambulatory Visit (INDEPENDENT_AMBULATORY_CARE_PROVIDER_SITE_OTHER): Payer: BLUE CROSS/BLUE SHIELD | Admitting: Internal Medicine

## 2018-05-27 ENCOUNTER — Encounter: Payer: Self-pay | Admitting: Internal Medicine

## 2018-05-27 VITALS — BP 126/80 | HR 88 | Ht 69.5 in | Wt 204.0 lb

## 2018-05-27 DIAGNOSIS — R55 Syncope and collapse: Secondary | ICD-10-CM

## 2018-05-27 DIAGNOSIS — I495 Sick sinus syndrome: Secondary | ICD-10-CM

## 2018-05-27 DIAGNOSIS — Z950 Presence of cardiac pacemaker: Secondary | ICD-10-CM | POA: Diagnosis not present

## 2018-05-27 NOTE — Progress Notes (Signed)
    PCP: Patient, No Pcp Per   Primary EP:  Dr Johney Frame  Daniel Saunders is a 53 y.o. male who presents today for routine electrophysiology followup.  Since last being seen in our clinic, the patient reports doing very well.  Today, he denies symptoms of palpitations, chest pain, shortness of breath,  lower extremity edema, dizziness, presyncope, or syncope.  The patient is otherwise without complaint today.   Past Medical History:  Diagnosis Date  . Symptomatic bradycardia 02/08/2017   a. s/p MDT dual chamber PPM    Past Surgical History:  Procedure Laterality Date  . INSERT / REPLACE / REMOVE PACEMAKER  02/08/2017  . PACEMAKER IMPLANT N/A 02/08/2017   Procedure: PACEMAKER IMPLANT;  Surgeon: Hillis Range, MD;  Location: [facility];  Service: Cardiovascular;  Laterality: N/A;  . TONSILLECTOMY      ROS- all systems are reviewed and negative except as per HPI above  Current Outpatient Medications  Medication Sig Dispense Refill  . acetaminophen (TYLENOL) 325 MG tablet Take 650 mg by mouth as needed.     No current facility-administered medications for this visit.     Physical Exam: Vitals:   05/27/18 1653  BP: 126/80  Pulse: 88  SpO2: 95%  Weight: 204 lb (92.5 kg)  Height: 5' 9.5" (1.765 m)    GEN- The patient is well appearing, alert and oriented x 3 today.   Head- normocephalic, atraumatic Eyes-  Sclera clear, conjunctiva pink Ears- hearing intact Oropharynx- clear Lungs- Clear to ausculation bilaterally, normal work of breathing Chest- pacemaker pocket is well healed Heart- Regular rate and rhythm, no murmurs, rubs or gallops, PMI not laterally displaced GI- soft, NT, ND, + BS Extremities- no clubbing, cyanosis, or edema  Pacemaker interrogation- reviewed in detail today,  See PACEART report  ekg tracing ordered today is personally reviewed and shows sinus rhythm  Assessment and Plan:  1. Symptomatic sinus bradycardia  Normal pacemaker function See  Pace Art report No changes today  2. Syncope Resolved s/p pacing  3. NSVT Asymptomatic Normal EF  Carelink Return to see me in a year  Hillis Range MD, [facility] 05/27/2018 5:11 PM

## 2018-05-27 NOTE — Patient Instructions (Signed)
Medication Instructions:  Your physician recommends that you continue on your current medications as directed. Please refer to the Current Medication list given to you today.  Labwork: None ordered.  Testing/Procedures: None ordered.  Follow-Up: Your physician wants you to follow-up in: one year with Dr. Johney Frame.   You will receive a reminder letter in the mail two months in advance. If you don't receive a letter, please call our office to schedule the follow-up appointment.  Remote monitoring is used to monitor your Pacemaker from home. This monitoring reduces the number of office visits required to check your device to one time per year. It allows [place] to keep an eye on the functioning of your device to ensure it is working properly. You are scheduled for a device check from home on 08/19/2018. You may send your transmission at any time that day. If you have a wireless device, the transmission will be sent automatically. After your physician reviews your transmission, you will receive a postcard with your next transmission date.  Any Other Special Instructions Will Be Listed Below (If Applicable).  If you need a refill on your cardiac medications before your next appointment, please call your pharmacy.

## 2018-05-28 LAB — CUP PACEART INCLINIC DEVICE CHECK
Battery Remaining Longevity: 169 mo
Battery Voltage: 3.07 V
Brady Statistic AP VP Percent: 0.01 %
Brady Statistic AS VS Percent: 94.35 %
Brady Statistic RA Percent Paced: 5.63 %
Date Time Interrogation Session: 20200203215252
Implantable Lead Implant Date: 20181018
Implantable Lead Location: 753860
Implantable Lead Model: 5076
Implantable Pulse Generator Implant Date: 20181018
Lead Channel Impedance Value: 323 Ohm
Lead Channel Pacing Threshold Pulse Width: 0.4 ms
Lead Channel Pacing Threshold Pulse Width: 0.4 ms
Lead Channel Sensing Intrinsic Amplitude: 10.5 mV
Lead Channel Setting Pacing Amplitude: 2 V
Lead Channel Setting Pacing Pulse Width: 0.4 ms
MDC IDC LEAD IMPLANT DT: 20181018
MDC IDC LEAD LOCATION: 753859
MDC IDC MSMT LEADCHNL RA IMPEDANCE VALUE: 361 Ohm
MDC IDC MSMT LEADCHNL RA IMPEDANCE VALUE: 494 Ohm
MDC IDC MSMT LEADCHNL RA PACING THRESHOLD AMPLITUDE: 0.5 V
MDC IDC MSMT LEADCHNL RA SENSING INTR AMPL: 2.375 mV
MDC IDC MSMT LEADCHNL RA SENSING INTR AMPL: 2.875 mV
MDC IDC MSMT LEADCHNL RV IMPEDANCE VALUE: 437 Ohm
MDC IDC MSMT LEADCHNL RV PACING THRESHOLD AMPLITUDE: 1.125 V
MDC IDC MSMT LEADCHNL RV SENSING INTR AMPL: 8 mV
MDC IDC SET LEADCHNL RV PACING AMPLITUDE: 2.5 V
MDC IDC SET LEADCHNL RV SENSING SENSITIVITY: 0.9 mV
MDC IDC STAT BRADY AP VS PERCENT: 5.62 %
MDC IDC STAT BRADY AS VP PERCENT: 0.02 %
MDC IDC STAT BRADY RV PERCENT PACED: 0.04 %

## 2018-08-19 ENCOUNTER — Ambulatory Visit (INDEPENDENT_AMBULATORY_CARE_PROVIDER_SITE_OTHER): Payer: BLUE CROSS/BLUE SHIELD | Admitting: *Deleted

## 2018-08-19 ENCOUNTER — Other Ambulatory Visit: Payer: Self-pay

## 2018-08-19 DIAGNOSIS — I495 Sick sinus syndrome: Secondary | ICD-10-CM | POA: Diagnosis not present

## 2018-08-19 DIAGNOSIS — R55 Syncope and collapse: Secondary | ICD-10-CM

## 2018-08-19 LAB — CUP PACEART REMOTE DEVICE CHECK
Battery Remaining Longevity: 166 mo
Battery Voltage: 3.06 V
Brady Statistic AP VP Percent: 0.01 %
Brady Statistic AP VS Percent: 3.9 %
Brady Statistic AS VP Percent: 0.02 %
Brady Statistic AS VS Percent: 96.06 %
Brady Statistic RA Percent Paced: 3.91 %
Brady Statistic RV Percent Paced: 0.03 %
Date Time Interrogation Session: 20200427010540
Implantable Lead Implant Date: 20181018
Implantable Lead Implant Date: 20181018
Implantable Lead Location: 753859
Implantable Lead Location: 753860
Implantable Lead Model: 5076
Implantable Lead Model: 5076
Implantable Pulse Generator Implant Date: 20181018
Lead Channel Impedance Value: 304 Ohm
Lead Channel Impedance Value: 361 Ohm
Lead Channel Impedance Value: 418 Ohm
Lead Channel Impedance Value: 494 Ohm
Lead Channel Pacing Threshold Amplitude: 0.5 V
Lead Channel Pacing Threshold Amplitude: 1.125 V
Lead Channel Pacing Threshold Pulse Width: 0.4 ms
Lead Channel Pacing Threshold Pulse Width: 0.4 ms
Lead Channel Sensing Intrinsic Amplitude: 3.25 mV
Lead Channel Sensing Intrinsic Amplitude: 3.25 mV
Lead Channel Sensing Intrinsic Amplitude: 8.5 mV
Lead Channel Sensing Intrinsic Amplitude: 8.5 mV
Lead Channel Setting Pacing Amplitude: 2 V
Lead Channel Setting Pacing Amplitude: 2.5 V
Lead Channel Setting Pacing Pulse Width: 0.4 ms
Lead Channel Setting Sensing Sensitivity: 0.9 mV

## 2018-08-28 NOTE — Progress Notes (Signed)
Remote pacemaker transmission.   

## 2018-11-18 ENCOUNTER — Ambulatory Visit (INDEPENDENT_AMBULATORY_CARE_PROVIDER_SITE_OTHER): Payer: BC Managed Care – PPO | Admitting: *Deleted

## 2018-11-18 DIAGNOSIS — I495 Sick sinus syndrome: Secondary | ICD-10-CM

## 2018-11-18 LAB — CUP PACEART REMOTE DEVICE CHECK
Battery Remaining Longevity: 163 mo
Battery Voltage: 3.05 V
Brady Statistic AP VP Percent: 0.01 %
Brady Statistic AP VS Percent: 5.55 %
Brady Statistic AS VP Percent: 0.03 %
Brady Statistic AS VS Percent: 94.41 %
Brady Statistic RA Percent Paced: 5.57 %
Brady Statistic RV Percent Paced: 0.04 %
Date Time Interrogation Session: 20200727161742
Implantable Lead Implant Date: 20181018
Implantable Lead Implant Date: 20181018
Implantable Lead Location: 753859
Implantable Lead Location: 753860
Implantable Lead Model: 5076
Implantable Lead Model: 5076
Implantable Pulse Generator Implant Date: 20181018
Lead Channel Impedance Value: 323 Ohm
Lead Channel Impedance Value: 361 Ohm
Lead Channel Impedance Value: 418 Ohm
Lead Channel Impedance Value: 532 Ohm
Lead Channel Pacing Threshold Amplitude: 0.5 V
Lead Channel Pacing Threshold Amplitude: 1.125 V
Lead Channel Pacing Threshold Pulse Width: 0.4 ms
Lead Channel Pacing Threshold Pulse Width: 0.4 ms
Lead Channel Sensing Intrinsic Amplitude: 10.375 mV
Lead Channel Sensing Intrinsic Amplitude: 10.375 mV
Lead Channel Sensing Intrinsic Amplitude: 2.5 mV
Lead Channel Sensing Intrinsic Amplitude: 2.5 mV
Lead Channel Setting Pacing Amplitude: 2 V
Lead Channel Setting Pacing Amplitude: 2.5 V
Lead Channel Setting Pacing Pulse Width: 0.4 ms
Lead Channel Setting Sensing Sensitivity: 0.9 mV

## 2018-12-06 ENCOUNTER — Encounter: Payer: Self-pay | Admitting: Cardiology

## 2018-12-06 NOTE — Progress Notes (Signed)
Remote pacemaker transmission.   

## 2019-02-18 ENCOUNTER — Ambulatory Visit (INDEPENDENT_AMBULATORY_CARE_PROVIDER_SITE_OTHER): Payer: BC Managed Care – PPO | Admitting: *Deleted

## 2019-02-18 DIAGNOSIS — R55 Syncope and collapse: Secondary | ICD-10-CM

## 2019-02-18 DIAGNOSIS — I495 Sick sinus syndrome: Secondary | ICD-10-CM | POA: Diagnosis not present

## 2019-02-19 LAB — CUP PACEART REMOTE DEVICE CHECK
Battery Remaining Longevity: 160 mo
Battery Voltage: 3.04 V
Brady Statistic AP VP Percent: 0.01 %
Brady Statistic AP VS Percent: 5.46 %
Brady Statistic AS VP Percent: 0.03 %
Brady Statistic AS VS Percent: 94.5 %
Brady Statistic RA Percent Paced: 5.48 %
Brady Statistic RV Percent Paced: 0.04 %
Date Time Interrogation Session: 20201027151246
Implantable Lead Implant Date: 20181018
Implantable Lead Implant Date: 20181018
Implantable Lead Location: 753859
Implantable Lead Location: 753860
Implantable Lead Model: 5076
Implantable Lead Model: 5076
Implantable Pulse Generator Implant Date: 20181018
Lead Channel Impedance Value: 323 Ohm
Lead Channel Impedance Value: 361 Ohm
Lead Channel Impedance Value: 418 Ohm
Lead Channel Impedance Value: 475 Ohm
Lead Channel Pacing Threshold Amplitude: 0.375 V
Lead Channel Pacing Threshold Amplitude: 1.125 V
Lead Channel Pacing Threshold Pulse Width: 0.4 ms
Lead Channel Pacing Threshold Pulse Width: 0.4 ms
Lead Channel Sensing Intrinsic Amplitude: 2.5 mV
Lead Channel Sensing Intrinsic Amplitude: 2.5 mV
Lead Channel Sensing Intrinsic Amplitude: 8.375 mV
Lead Channel Sensing Intrinsic Amplitude: 8.375 mV
Lead Channel Setting Pacing Amplitude: 2 V
Lead Channel Setting Pacing Amplitude: 2.5 V
Lead Channel Setting Pacing Pulse Width: 0.4 ms
Lead Channel Setting Sensing Sensitivity: 0.9 mV

## 2019-03-12 NOTE — Progress Notes (Signed)
Remote pacemaker transmission.   

## 2019-05-20 ENCOUNTER — Ambulatory Visit (INDEPENDENT_AMBULATORY_CARE_PROVIDER_SITE_OTHER): Payer: Self-pay | Admitting: *Deleted

## 2019-05-20 DIAGNOSIS — I495 Sick sinus syndrome: Secondary | ICD-10-CM

## 2019-05-21 LAB — CUP PACEART REMOTE DEVICE CHECK
Battery Remaining Longevity: 157 mo
Battery Voltage: 3.04 V
Brady Statistic AP VP Percent: 0.01 %
Brady Statistic AP VS Percent: 3.73 %
Brady Statistic AS VP Percent: 0.02 %
Brady Statistic AS VS Percent: 96.24 %
Brady Statistic RA Percent Paced: 3.73 %
Brady Statistic RV Percent Paced: 0.03 %
Date Time Interrogation Session: 20210126105047
Implantable Lead Implant Date: 20181018
Implantable Lead Implant Date: 20181018
Implantable Lead Location: 753859
Implantable Lead Location: 753860
Implantable Lead Model: 5076
Implantable Lead Model: 5076
Implantable Pulse Generator Implant Date: 20181018
Lead Channel Impedance Value: 304 Ohm
Lead Channel Impedance Value: 361 Ohm
Lead Channel Impedance Value: 418 Ohm
Lead Channel Impedance Value: 456 Ohm
Lead Channel Pacing Threshold Amplitude: 0.375 V
Lead Channel Pacing Threshold Amplitude: 1.125 V
Lead Channel Pacing Threshold Pulse Width: 0.4 ms
Lead Channel Pacing Threshold Pulse Width: 0.4 ms
Lead Channel Sensing Intrinsic Amplitude: 2.625 mV
Lead Channel Sensing Intrinsic Amplitude: 2.625 mV
Lead Channel Sensing Intrinsic Amplitude: 6.25 mV
Lead Channel Sensing Intrinsic Amplitude: 6.25 mV
Lead Channel Setting Pacing Amplitude: 2 V
Lead Channel Setting Pacing Amplitude: 2.5 V
Lead Channel Setting Pacing Pulse Width: 0.4 ms
Lead Channel Setting Sensing Sensitivity: 0.9 mV

## 2019-05-28 ENCOUNTER — Telehealth: Payer: Self-pay

## 2019-06-02 ENCOUNTER — Encounter: Payer: Self-pay | Admitting: Internal Medicine

## 2019-06-02 ENCOUNTER — Telehealth (INDEPENDENT_AMBULATORY_CARE_PROVIDER_SITE_OTHER): Payer: BC Managed Care – PPO | Admitting: Internal Medicine

## 2019-06-02 VITALS — Ht 69.5 in | Wt 210.0 lb

## 2019-06-02 DIAGNOSIS — R55 Syncope and collapse: Secondary | ICD-10-CM

## 2019-06-02 DIAGNOSIS — I495 Sick sinus syndrome: Secondary | ICD-10-CM | POA: Diagnosis not present

## 2019-06-02 NOTE — Progress Notes (Signed)
Electrophysiology TeleHealth Note   Due to national recommendations of social distancing due to COVID 19, an audio/video telehealth visit is felt to be most appropriate for this patient at this time.  See MyChart message from today for the patient's consent to telehealth for [facility].  Date:  06/02/2019   ID:  Daniel Saunders, DOB [birth year], MRN [id number]  Location: patient's home  Provider location:  [facility]  Evaluation Performed: Follow-up visit  PCP:  Patient, No Pcp Per   Electrophysiologist:  Dr Johney Frame  Chief Complaint:  syncope  History of Present Illness:    Daniel Saunders is a 54 y.o. male who presents via telehealth conferencing today.  Since last being seen in our clinic, the patient reports doing very well.  Today, he denies symptoms of palpitations, chest pain, shortness of breath,  lower extremity edema, dizziness, presyncope, or syncope.  The patient is otherwise without complaint today.  The patient denies symptoms of fevers, chills, cough, or new SOB worrisome for COVID 19.  Past Medical History:  Diagnosis Date  . Symptomatic bradycardia 02/08/2017   a. s/p MDT dual chamber PPM     Past Surgical History:  Procedure Laterality Date  . INSERT / REPLACE / REMOVE PACEMAKER  02/08/2017  . PACEMAKER IMPLANT N/A 02/08/2017   Procedure: PACEMAKER IMPLANT;  Surgeon: Hillis Range, MD;  Location: [facility] INVASIVE CV LAB;  Service: Cardiovascular;  Laterality: N/A;  . TONSILLECTOMY      Current Outpatient Medications  Medication Sig Dispense Refill  . acetaminophen (TYLENOL) 325 MG tablet Take 650 mg by mouth as needed.     No current facility-administered medications for this visit.    Allergies:   Patient has no known allergies.   Social History:  The patient  reports that he has never smoked. He has never used smokeless tobacco. He reports current alcohol use of about 2.0 standard drinks of alcohol per week. He reports that he does not use drugs.    Family History:  The patient's family history includes Arrhythmia in his maternal grandmother; Diabetes in his mother.   ROS:  Please see the history of present illness.   All other systems are personally reviewed and negative.    Exam:    Vital Signs:  Ht 5' 9.5" (1.765 m)   Wt 210 lb (95.3 kg)   BMI 30.57 kg/m   Well sounding and appearing, alert and conversant, regular work of breathing,  good skin color Eyes- anicteric, neuro- grossly intact, skin- no apparent rash or lesions or cyanosis, mouth- oral mucosa is pink  Labs/Other Tests and Data Reviewed:    Recent Labs: No results found for requested labs within last 8760 hours.   Wt Readings from Last 3 Encounters:  06/02/19 210 lb (95.3 kg)  05/27/18 204 lb (92.5 kg)  05/11/17 191 lb 4 oz (86.8 kg)     Last device remote is reviewed from P[employer] PDF which reveals normal device function, no arrhythmias    ASSESSMENT & PLAN:    1.  Sick sinus syndrome with syncope Remotes are up to date Normal device function Syncope is resolved  Follow-up:  12 months with me   Patient Risk:  after full review of this patients clinical status, I feel that they are at moderate risk at this time.  Today, I have spent 15 minutes with the patient with telehealth technology discussing arrhythmia management .    Signed, Hillis Range, MD  06/02/2019 4:11 PM  [place] [address] [phone] (office) [phone] (fax)

## 2019-08-19 ENCOUNTER — Ambulatory Visit (INDEPENDENT_AMBULATORY_CARE_PROVIDER_SITE_OTHER): Payer: BC Managed Care – PPO | Admitting: *Deleted

## 2019-08-19 DIAGNOSIS — I495 Sick sinus syndrome: Secondary | ICD-10-CM | POA: Diagnosis not present

## 2019-08-19 LAB — CUP PACEART REMOTE DEVICE CHECK
Battery Remaining Longevity: 154 mo
Battery Voltage: 3.03 V
Brady Statistic AP VP Percent: 0.01 %
Brady Statistic AP VS Percent: 4.7 %
Brady Statistic AS VP Percent: 0.03 %
Brady Statistic AS VS Percent: 95.26 %
Brady Statistic RA Percent Paced: 4.71 %
Brady Statistic RV Percent Paced: 0.04 %
Date Time Interrogation Session: 20210427064955
Implantable Lead Implant Date: 20181018
Implantable Lead Implant Date: 20181018
Implantable Lead Location: 753859
Implantable Lead Location: 753860
Implantable Lead Model: 5076
Implantable Lead Model: 5076
Implantable Pulse Generator Implant Date: 20181018
Lead Channel Impedance Value: 304 Ohm
Lead Channel Impedance Value: 361 Ohm
Lead Channel Impedance Value: 399 Ohm
Lead Channel Impedance Value: 494 Ohm
Lead Channel Pacing Threshold Amplitude: 0.375 V
Lead Channel Pacing Threshold Amplitude: 1.125 V
Lead Channel Pacing Threshold Pulse Width: 0.4 ms
Lead Channel Pacing Threshold Pulse Width: 0.4 ms
Lead Channel Sensing Intrinsic Amplitude: 10.25 mV
Lead Channel Sensing Intrinsic Amplitude: 10.25 mV
Lead Channel Sensing Intrinsic Amplitude: 2.5 mV
Lead Channel Sensing Intrinsic Amplitude: 2.5 mV
Lead Channel Setting Pacing Amplitude: 2 V
Lead Channel Setting Pacing Amplitude: 2.5 V
Lead Channel Setting Pacing Pulse Width: 0.4 ms
Lead Channel Setting Sensing Sensitivity: 0.9 mV

## 2019-08-20 NOTE — Progress Notes (Signed)
PPM Remote  

## 2019-11-18 ENCOUNTER — Ambulatory Visit (INDEPENDENT_AMBULATORY_CARE_PROVIDER_SITE_OTHER): Payer: BC Managed Care – PPO | Admitting: *Deleted

## 2019-11-18 DIAGNOSIS — I495 Sick sinus syndrome: Secondary | ICD-10-CM | POA: Diagnosis not present

## 2019-11-19 LAB — CUP PACEART REMOTE DEVICE CHECK
Battery Remaining Longevity: 151 mo
Battery Voltage: 3.03 V
Brady Statistic AP VP Percent: 0.02 %
Brady Statistic AP VS Percent: 8.23 %
Brady Statistic AS VP Percent: 0.03 %
Brady Statistic AS VS Percent: 91.73 %
Brady Statistic RA Percent Paced: 8.25 %
Brady Statistic RV Percent Paced: 0.04 %
Date Time Interrogation Session: 20210727061248
Implantable Lead Implant Date: 20181018
Implantable Lead Implant Date: 20181018
Implantable Lead Location: 753859
Implantable Lead Location: 753860
Implantable Lead Model: 5076
Implantable Lead Model: 5076
Implantable Pulse Generator Implant Date: 20181018
Lead Channel Impedance Value: 304 Ohm
Lead Channel Impedance Value: 361 Ohm
Lead Channel Impedance Value: 418 Ohm
Lead Channel Impedance Value: 456 Ohm
Lead Channel Pacing Threshold Amplitude: 0.5 V
Lead Channel Pacing Threshold Amplitude: 0.75 V
Lead Channel Pacing Threshold Pulse Width: 0.4 ms
Lead Channel Pacing Threshold Pulse Width: 0.4 ms
Lead Channel Sensing Intrinsic Amplitude: 10.5 mV
Lead Channel Sensing Intrinsic Amplitude: 10.5 mV
Lead Channel Sensing Intrinsic Amplitude: 2.25 mV
Lead Channel Sensing Intrinsic Amplitude: 2.25 mV
Lead Channel Setting Pacing Amplitude: 2 V
Lead Channel Setting Pacing Amplitude: 2.5 V
Lead Channel Setting Pacing Pulse Width: 0.4 ms
Lead Channel Setting Sensing Sensitivity: 0.9 mV

## 2019-11-24 NOTE — Progress Notes (Signed)
Remote pacemaker transmission.   

## 2020-02-17 ENCOUNTER — Ambulatory Visit (INDEPENDENT_AMBULATORY_CARE_PROVIDER_SITE_OTHER): Payer: BC Managed Care – PPO

## 2020-02-17 DIAGNOSIS — I495 Sick sinus syndrome: Secondary | ICD-10-CM | POA: Diagnosis not present

## 2020-02-17 LAB — CUP PACEART REMOTE DEVICE CHECK
Battery Remaining Longevity: 147 mo
Battery Voltage: 3.03 V
Brady Statistic AP VP Percent: 0.01 %
Brady Statistic AP VS Percent: 7.91 %
Brady Statistic AS VP Percent: 0.03 %
Brady Statistic AS VS Percent: 92.05 %
Brady Statistic RA Percent Paced: 7.92 %
Brady Statistic RV Percent Paced: 0.04 %
Date Time Interrogation Session: 20211026062902
Implantable Lead Implant Date: 20181018
Implantable Lead Implant Date: 20181018
Implantable Lead Location: 753859
Implantable Lead Location: 753860
Implantable Lead Model: 5076
Implantable Lead Model: 5076
Implantable Pulse Generator Implant Date: 20181018
Lead Channel Impedance Value: 323 Ohm
Lead Channel Impedance Value: 361 Ohm
Lead Channel Impedance Value: 418 Ohm
Lead Channel Impedance Value: 456 Ohm
Lead Channel Pacing Threshold Amplitude: 0.375 V
Lead Channel Pacing Threshold Amplitude: 0.75 V
Lead Channel Pacing Threshold Pulse Width: 0.4 ms
Lead Channel Pacing Threshold Pulse Width: 0.4 ms
Lead Channel Sensing Intrinsic Amplitude: 2.125 mV
Lead Channel Sensing Intrinsic Amplitude: 2.125 mV
Lead Channel Sensing Intrinsic Amplitude: 8.25 mV
Lead Channel Sensing Intrinsic Amplitude: 8.25 mV
Lead Channel Setting Pacing Amplitude: 2 V
Lead Channel Setting Pacing Amplitude: 2.5 V
Lead Channel Setting Pacing Pulse Width: 0.4 ms
Lead Channel Setting Sensing Sensitivity: 0.9 mV

## 2020-02-23 NOTE — Progress Notes (Signed)
Remote pacemaker transmission.   

## 2020-05-18 ENCOUNTER — Ambulatory Visit (INDEPENDENT_AMBULATORY_CARE_PROVIDER_SITE_OTHER): Payer: BC Managed Care – PPO

## 2020-05-18 DIAGNOSIS — I495 Sick sinus syndrome: Secondary | ICD-10-CM | POA: Diagnosis not present

## 2020-05-18 LAB — CUP PACEART REMOTE DEVICE CHECK
Battery Remaining Longevity: 145 mo
Battery Voltage: 3.03 V
Brady Statistic AP VP Percent: 0.01 %
Brady Statistic AP VS Percent: 4.06 %
Brady Statistic AS VP Percent: 0.02 %
Brady Statistic AS VS Percent: 95.91 %
Brady Statistic RA Percent Paced: 4.06 %
Brady Statistic RV Percent Paced: 0.03 %
Date Time Interrogation Session: 20220125062535
Implantable Lead Implant Date: 20181018
Implantable Lead Implant Date: 20181018
Implantable Lead Location: 753859
Implantable Lead Location: 753860
Implantable Lead Model: 5076
Implantable Lead Model: 5076
Implantable Pulse Generator Implant Date: 20181018
Lead Channel Impedance Value: 304 Ohm
Lead Channel Impedance Value: 361 Ohm
Lead Channel Impedance Value: 418 Ohm
Lead Channel Impedance Value: 475 Ohm
Lead Channel Pacing Threshold Amplitude: 0.5 V
Lead Channel Pacing Threshold Amplitude: 0.75 V
Lead Channel Pacing Threshold Pulse Width: 0.4 ms
Lead Channel Pacing Threshold Pulse Width: 0.4 ms
Lead Channel Sensing Intrinsic Amplitude: 11 mV
Lead Channel Sensing Intrinsic Amplitude: 11 mV
Lead Channel Sensing Intrinsic Amplitude: 2.625 mV
Lead Channel Sensing Intrinsic Amplitude: 2.625 mV
Lead Channel Setting Pacing Amplitude: 2 V
Lead Channel Setting Pacing Amplitude: 2.5 V
Lead Channel Setting Pacing Pulse Width: 0.4 ms
Lead Channel Setting Sensing Sensitivity: 0.9 mV

## 2020-05-29 NOTE — Progress Notes (Signed)
Remote pacemaker transmission.   

## 2020-08-17 ENCOUNTER — Ambulatory Visit (INDEPENDENT_AMBULATORY_CARE_PROVIDER_SITE_OTHER): Payer: BC Managed Care – PPO

## 2020-08-17 DIAGNOSIS — I495 Sick sinus syndrome: Secondary | ICD-10-CM

## 2020-08-17 LAB — CUP PACEART REMOTE DEVICE CHECK
Battery Remaining Longevity: 142 mo
Battery Voltage: 3.02 V
Brady Statistic AP VP Percent: 0.01 %
Brady Statistic AP VS Percent: 2.36 %
Brady Statistic AS VP Percent: 0.02 %
Brady Statistic AS VS Percent: 97.61 %
Brady Statistic RA Percent Paced: 2.37 %
Brady Statistic RV Percent Paced: 0.03 %
Date Time Interrogation Session: 20220426062518
Implantable Lead Implant Date: 20181018
Implantable Lead Implant Date: 20181018
Implantable Lead Location: 753859
Implantable Lead Location: 753860
Implantable Lead Model: 5076
Implantable Lead Model: 5076
Implantable Pulse Generator Implant Date: 20181018
Lead Channel Impedance Value: 304 Ohm
Lead Channel Impedance Value: 342 Ohm
Lead Channel Impedance Value: 418 Ohm
Lead Channel Impedance Value: 475 Ohm
Lead Channel Pacing Threshold Amplitude: 0.5 V
Lead Channel Pacing Threshold Amplitude: 0.75 V
Lead Channel Pacing Threshold Pulse Width: 0.4 ms
Lead Channel Pacing Threshold Pulse Width: 0.4 ms
Lead Channel Sensing Intrinsic Amplitude: 10.5 mV
Lead Channel Sensing Intrinsic Amplitude: 10.5 mV
Lead Channel Sensing Intrinsic Amplitude: 3 mV
Lead Channel Sensing Intrinsic Amplitude: 3 mV
Lead Channel Setting Pacing Amplitude: 2 V
Lead Channel Setting Pacing Amplitude: 2.5 V
Lead Channel Setting Pacing Pulse Width: 0.4 ms
Lead Channel Setting Sensing Sensitivity: 0.9 mV

## 2020-09-08 NOTE — Progress Notes (Signed)
Remote pacemaker transmission.   

## 2020-11-16 ENCOUNTER — Ambulatory Visit (INDEPENDENT_AMBULATORY_CARE_PROVIDER_SITE_OTHER): Payer: BC Managed Care – PPO

## 2020-11-16 DIAGNOSIS — I495 Sick sinus syndrome: Secondary | ICD-10-CM | POA: Diagnosis not present

## 2020-11-16 LAB — CUP PACEART REMOTE DEVICE CHECK
Battery Remaining Longevity: 139 mo
Battery Voltage: 3.02 V
Brady Statistic AP VP Percent: 0.02 %
Brady Statistic AP VS Percent: 5.36 %
Brady Statistic AS VP Percent: 0.03 %
Brady Statistic AS VS Percent: 94.6 %
Brady Statistic RA Percent Paced: 5.37 %
Brady Statistic RV Percent Paced: 0.04 %
Date Time Interrogation Session: 20220725212441
Implantable Lead Implant Date: 20181018
Implantable Lead Implant Date: 20181018
Implantable Lead Location: 753859
Implantable Lead Location: 753860
Implantable Lead Model: 5076
Implantable Lead Model: 5076
Implantable Pulse Generator Implant Date: 20181018
Lead Channel Impedance Value: 323 Ohm
Lead Channel Impedance Value: 361 Ohm
Lead Channel Impedance Value: 418 Ohm
Lead Channel Impedance Value: 494 Ohm
Lead Channel Pacing Threshold Amplitude: 0.5 V
Lead Channel Pacing Threshold Amplitude: 0.75 V
Lead Channel Pacing Threshold Pulse Width: 0.4 ms
Lead Channel Pacing Threshold Pulse Width: 0.4 ms
Lead Channel Sensing Intrinsic Amplitude: 2.375 mV
Lead Channel Sensing Intrinsic Amplitude: 2.375 mV
Lead Channel Sensing Intrinsic Amplitude: 9.875 mV
Lead Channel Sensing Intrinsic Amplitude: 9.875 mV
Lead Channel Setting Pacing Amplitude: 2 V
Lead Channel Setting Pacing Amplitude: 2.5 V
Lead Channel Setting Pacing Pulse Width: 0.4 ms
Lead Channel Setting Sensing Sensitivity: 0.9 mV

## 2020-12-13 NOTE — Progress Notes (Signed)
Remote pacemaker transmission.   

## 2021-02-15 ENCOUNTER — Ambulatory Visit (INDEPENDENT_AMBULATORY_CARE_PROVIDER_SITE_OTHER): Payer: BC Managed Care – PPO

## 2021-02-15 DIAGNOSIS — I495 Sick sinus syndrome: Secondary | ICD-10-CM

## 2021-02-15 LAB — CUP PACEART REMOTE DEVICE CHECK
Battery Remaining Longevity: 136 mo
Battery Voltage: 3.02 V
Brady Statistic AP VP Percent: 0.02 %
Brady Statistic AP VS Percent: 6.33 %
Brady Statistic AS VP Percent: 0.03 %
Brady Statistic AS VS Percent: 93.63 %
Brady Statistic RA Percent Paced: 6.35 %
Brady Statistic RV Percent Paced: 0.04 %
Date Time Interrogation Session: 20221025021555
Implantable Lead Implant Date: 20181018
Implantable Lead Implant Date: 20181018
Implantable Lead Location: 753859
Implantable Lead Location: 753860
Implantable Lead Model: 5076
Implantable Lead Model: 5076
Implantable Pulse Generator Implant Date: 20181018
Lead Channel Impedance Value: 323 Ohm
Lead Channel Impedance Value: 361 Ohm
Lead Channel Impedance Value: 418 Ohm
Lead Channel Impedance Value: 494 Ohm
Lead Channel Pacing Threshold Amplitude: 0.5 V
Lead Channel Pacing Threshold Amplitude: 0.75 V
Lead Channel Pacing Threshold Pulse Width: 0.4 ms
Lead Channel Pacing Threshold Pulse Width: 0.4 ms
Lead Channel Sensing Intrinsic Amplitude: 2.75 mV
Lead Channel Sensing Intrinsic Amplitude: 2.75 mV
Lead Channel Sensing Intrinsic Amplitude: 9.625 mV
Lead Channel Sensing Intrinsic Amplitude: 9.625 mV
Lead Channel Setting Pacing Amplitude: 2 V
Lead Channel Setting Pacing Amplitude: 2.5 V
Lead Channel Setting Pacing Pulse Width: 0.4 ms
Lead Channel Setting Sensing Sensitivity: 0.9 mV

## 2021-02-23 NOTE — Progress Notes (Signed)
Remote pacemaker transmission.   

## 2021-05-17 ENCOUNTER — Ambulatory Visit (INDEPENDENT_AMBULATORY_CARE_PROVIDER_SITE_OTHER): Payer: BC Managed Care – PPO

## 2021-05-17 DIAGNOSIS — I495 Sick sinus syndrome: Secondary | ICD-10-CM

## 2021-05-17 LAB — CUP PACEART REMOTE DEVICE CHECK
Battery Remaining Longevity: 133 mo
Battery Voltage: 3.02 V
Brady Statistic AP VP Percent: 0.01 %
Brady Statistic AP VS Percent: 3.78 %
Brady Statistic AS VP Percent: 0.02 %
Brady Statistic AS VS Percent: 96.18 %
Brady Statistic RA Percent Paced: 3.79 %
Brady Statistic RV Percent Paced: 0.04 %
Date Time Interrogation Session: 20230124063625
Implantable Lead Implant Date: 20181018
Implantable Lead Implant Date: 20181018
Implantable Lead Location: 753859
Implantable Lead Location: 753860
Implantable Lead Model: 5076
Implantable Lead Model: 5076
Implantable Pulse Generator Implant Date: 20181018
Lead Channel Impedance Value: 323 Ohm
Lead Channel Impedance Value: 380 Ohm
Lead Channel Impedance Value: 437 Ohm
Lead Channel Impedance Value: 475 Ohm
Lead Channel Pacing Threshold Amplitude: 0.5 V
Lead Channel Pacing Threshold Amplitude: 0.75 V
Lead Channel Pacing Threshold Pulse Width: 0.4 ms
Lead Channel Pacing Threshold Pulse Width: 0.4 ms
Lead Channel Sensing Intrinsic Amplitude: 2.25 mV
Lead Channel Sensing Intrinsic Amplitude: 2.25 mV
Lead Channel Sensing Intrinsic Amplitude: 9.875 mV
Lead Channel Sensing Intrinsic Amplitude: 9.875 mV
Lead Channel Setting Pacing Amplitude: 2 V
Lead Channel Setting Pacing Amplitude: 2.5 V
Lead Channel Setting Pacing Pulse Width: 0.4 ms
Lead Channel Setting Sensing Sensitivity: 0.9 mV

## 2021-05-27 NOTE — Progress Notes (Signed)
Remote pacemaker transmission.   

## 2021-08-16 ENCOUNTER — Ambulatory Visit (INDEPENDENT_AMBULATORY_CARE_PROVIDER_SITE_OTHER): Payer: BC Managed Care – PPO

## 2021-08-16 DIAGNOSIS — I495 Sick sinus syndrome: Secondary | ICD-10-CM

## 2021-08-17 LAB — CUP PACEART REMOTE DEVICE CHECK
Battery Remaining Longevity: 130 mo
Battery Voltage: 3.02 V
Brady Statistic AP VP Percent: 0.01 %
Brady Statistic AP VS Percent: 2.08 %
Brady Statistic AS VP Percent: 0.02 %
Brady Statistic AS VS Percent: 97.89 %
Brady Statistic RA Percent Paced: 2.08 %
Brady Statistic RV Percent Paced: 0.03 %
Date Time Interrogation Session: 20230426063610
Implantable Lead Implant Date: 20181018
Implantable Lead Implant Date: 20181018
Implantable Lead Location: 753859
Implantable Lead Location: 753860
Implantable Lead Model: 5076
Implantable Lead Model: 5076
Implantable Pulse Generator Implant Date: 20181018
Lead Channel Impedance Value: 323 Ohm
Lead Channel Impedance Value: 380 Ohm
Lead Channel Impedance Value: 418 Ohm
Lead Channel Impedance Value: 456 Ohm
Lead Channel Pacing Threshold Amplitude: 0.375 V
Lead Channel Pacing Threshold Amplitude: 0.75 V
Lead Channel Pacing Threshold Pulse Width: 0.4 ms
Lead Channel Pacing Threshold Pulse Width: 0.4 ms
Lead Channel Sensing Intrinsic Amplitude: 10.25 mV
Lead Channel Sensing Intrinsic Amplitude: 10.25 mV
Lead Channel Sensing Intrinsic Amplitude: 2.625 mV
Lead Channel Sensing Intrinsic Amplitude: 2.625 mV
Lead Channel Setting Pacing Amplitude: 2 V
Lead Channel Setting Pacing Amplitude: 2.5 V
Lead Channel Setting Pacing Pulse Width: 0.4 ms
Lead Channel Setting Sensing Sensitivity: 0.9 mV

## 2021-09-01 NOTE — Progress Notes (Signed)
Remote pacemaker transmission.   

## 2021-11-15 ENCOUNTER — Ambulatory Visit (INDEPENDENT_AMBULATORY_CARE_PROVIDER_SITE_OTHER): Payer: BC Managed Care – PPO

## 2021-11-15 DIAGNOSIS — I495 Sick sinus syndrome: Secondary | ICD-10-CM

## 2021-11-15 LAB — CUP PACEART REMOTE DEVICE CHECK
Battery Remaining Longevity: 127 mo
Battery Voltage: 3.02 V
Brady Statistic AP VP Percent: 0.01 %
Brady Statistic AP VS Percent: 5.36 %
Brady Statistic AS VP Percent: 0.03 %
Brady Statistic AS VS Percent: 94.6 %
Brady Statistic RA Percent Paced: 5.37 %
Brady Statistic RV Percent Paced: 0.04 %
Date Time Interrogation Session: 20230725064043
Implantable Lead Implant Date: 20181018
Implantable Lead Implant Date: 20181018
Implantable Lead Location: 753859
Implantable Lead Location: 753860
Implantable Lead Model: 5076
Implantable Lead Model: 5076
Implantable Pulse Generator Implant Date: 20181018
Lead Channel Impedance Value: 323 Ohm
Lead Channel Impedance Value: 361 Ohm
Lead Channel Impedance Value: 418 Ohm
Lead Channel Impedance Value: 418 Ohm
Lead Channel Pacing Threshold Amplitude: 0.375 V
Lead Channel Pacing Threshold Amplitude: 0.75 V
Lead Channel Pacing Threshold Pulse Width: 0.4 ms
Lead Channel Pacing Threshold Pulse Width: 0.4 ms
Lead Channel Sensing Intrinsic Amplitude: 10 mV
Lead Channel Sensing Intrinsic Amplitude: 10 mV
Lead Channel Sensing Intrinsic Amplitude: 2.25 mV
Lead Channel Sensing Intrinsic Amplitude: 2.25 mV
Lead Channel Setting Pacing Amplitude: 2 V
Lead Channel Setting Pacing Amplitude: 2.5 V
Lead Channel Setting Pacing Pulse Width: 0.4 ms
Lead Channel Setting Sensing Sensitivity: 0.9 mV

## 2021-11-24 ENCOUNTER — Encounter (HOSPITAL_BASED_OUTPATIENT_CLINIC_OR_DEPARTMENT_OTHER): Payer: BC Managed Care – PPO | Admitting: Internal Medicine

## 2021-12-10 NOTE — Progress Notes (Unsigned)
Cardiology Office Note Date:  12/10/2021  Patient ID:  Daniel Saunders, Daniel Saunders [birth year], MRN [id number] PCP:  Patient, No Pcp Per  Electrophysiologist: Dr. Johney Frame    Chief Complaint:  annual visit  History of Present Illness: Daniel Saunders is a 56 y.o. male with history of symptomatic bradycardia w/PPM.  She comes in last seen by Dr. Johney Frame, via telehealth visit Feb 2021.  She was doing well, remotes were up to date and normal, planned for EP f/u in a year.  TODAY He is doing great. 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 No CP, palpitations or exertional intolerances. No SOB No near syncope or syncope.   Device information MDT dual chamber PPM implanted 02/08/2017   Past Medical History:  Diagnosis Date   Symptomatic bradycardia 02/08/2017   a. s/p MDT dual chamber PPM     Past Surgical History:  Procedure Laterality Date   INSERT / REPLACE / REMOVE PACEMAKER  02/08/2017   PACEMAKER IMPLANT N/A 02/08/2017   Procedure: PACEMAKER IMPLANT;  Surgeon: Hillis Range, MD;  Location: [facility] INVASIVE CV LAB;  Service: Cardiovascular;  Laterality: N/A;   TONSILLECTOMY      Current Outpatient Medications  Medication Sig Dispense Refill   acetaminophen (TYLENOL) 325 MG tablet Take 650 mg by mouth as needed.     No current facility-administered medications for this visit.    Allergies:   Patient has no known allergies.   Social History:  The patient  reports that he has never smoked. He has never used smokeless tobacco. He reports current alcohol use of about 2.0 standard drinks of alcohol per week. He reports that he does not use drugs.   Family History:  The patient's family history includes Arrhythmia in his maternal grandmother; Diabetes in his mother.  ROS:  Please see the history of present illness.    All other systems are reviewed and otherwise negative.   PHYSICAL EXAM:  VS:  There were no vitals taken for this visit. BMI: There is no height or weight on file to  calculate BMI. Well nourished, well developed, in no acute distress HEENT: normocephalic, atraumatic Neck: no JVD, carotid bruits or masses Cardiac:  RRR; no significant murmurs, no rubs, or gallops Lungs:   CTA b/l, no wheezing, rhonchi or rales Abd: soft, nontender MS: no deformity or  atrophy Ext:  no edema Skin: warm and dry, no rash Neuro:  No gross deficits appreciated Psych: euthymic mood, full affect  PPM site is stable, no tethering or discomfort   EKG:  Done today and reviewed by myself shows  SR 81, unchanged from prior  Device interrogation done today and reviewed by myself:  Battery and lead measurements are god Some short AHR episodes, are ST/SVT Very remote and short Afib episodes (2)    02/08/2017: TTE Study Conclusions  - Left ventricle: The cavity size was normal. There was mild    concentric hypertrophy. Systolic function was normal. The    estimated ejection fraction was in the range of 55% to 60%. Wall    motion was normal; there were no regional wall motion    abnormalities. Doppler parameters are consistent with abnormal    left ventricular relaxation (grade 1 diastolic dysfunction).    There was no evidence of elevated ventricular filling pressure by    Doppler parameters.  - Aortic valve: There was no regurgitation.  - Mitral valve: There was trivial regurgitation.  - Tricuspid valve: There was no regurgitation.  - Pulmonary arteries: Systolic pressure  could not be accurately    estimated.  - Inferior vena cava: The vessel was normal in size.  - Pericardium, extracardiac: There was no pericardial effusion.   Recent Labs: No results found for requested labs within last 365 days.  No results found for requested labs within last 365 days.   CrCl cannot be calculated (Patient's most recent lab result is older than the maximum 21 days allowed.).   Wt Readings from Last 3 Encounters:  06/02/19 210 lb (95.3 kg)  05/27/18 204 lb (92.5 kg)   05/11/17 191 lb 4 oz (86.8 kg)     Other studies reviewed: Additional studies/records reviewed today include: summarized above  ASSESSMENT AND PLAN:  PPM Intact function, no programming changes made  Encouraged he get a PMD Has not had his cholesterol checked in years, will have him back fasting for lipids/LFTs  Discussed Dr. Jenel Lucks departure, will transition to Dr. Lalla Brothers.  Disposition: F/u with EP in a year, sooner if needed, remotes as usual  Current medicines are reviewed at length with the patient today.  The patient did not have any concerns regarding medicines.  Norma Fredrickson, PA-C 12/10/2021 3:22 PM     [facility] [address] [place] [address] [phone] (office)  [phone] (fax)

## 2021-12-13 ENCOUNTER — Ambulatory Visit (INDEPENDENT_AMBULATORY_CARE_PROVIDER_SITE_OTHER): Payer: BC Managed Care – PPO | Admitting: Physician Assistant

## 2021-12-13 ENCOUNTER — Encounter: Payer: Self-pay | Admitting: Physician Assistant

## 2021-12-13 VITALS — BP 130/80 | HR 81 | Ht 69.0 in | Wt 217.0 lb

## 2021-12-13 DIAGNOSIS — Z13220 Encounter for screening for lipoid disorders: Secondary | ICD-10-CM

## 2021-12-13 DIAGNOSIS — I495 Sick sinus syndrome: Secondary | ICD-10-CM

## 2021-12-13 DIAGNOSIS — Z79899 Other long term (current) drug therapy: Secondary | ICD-10-CM

## 2021-12-13 DIAGNOSIS — Z950 Presence of cardiac pacemaker: Secondary | ICD-10-CM

## 2021-12-13 LAB — CUP PACEART INCLINIC DEVICE CHECK
Battery Remaining Longevity: 126 mo
Battery Voltage: 3.02 V
Brady Statistic AP VP Percent: 0.01 %
Brady Statistic AP VS Percent: 4.63 %
Brady Statistic AS VP Percent: 0.03 %
Brady Statistic AS VS Percent: 95.33 %
Brady Statistic RA Percent Paced: 4.64 %
Brady Statistic RV Percent Paced: 0.04 %
Date Time Interrogation Session: 20230822190418
Implantable Lead Implant Date: 20181018
Implantable Lead Implant Date: 20181018
Implantable Lead Location: 753859
Implantable Lead Location: 753860
Implantable Lead Model: 5076
Implantable Lead Model: 5076
Implantable Pulse Generator Implant Date: 20181018
Lead Channel Impedance Value: 342 Ohm
Lead Channel Impedance Value: 361 Ohm
Lead Channel Impedance Value: 437 Ohm
Lead Channel Impedance Value: 456 Ohm
Lead Channel Pacing Threshold Amplitude: 0.375 V
Lead Channel Pacing Threshold Amplitude: 1 V
Lead Channel Pacing Threshold Pulse Width: 0.4 ms
Lead Channel Pacing Threshold Pulse Width: 0.4 ms
Lead Channel Sensing Intrinsic Amplitude: 10.125 mV
Lead Channel Sensing Intrinsic Amplitude: 11.25 mV
Lead Channel Sensing Intrinsic Amplitude: 2.625 mV
Lead Channel Sensing Intrinsic Amplitude: 2.875 mV
Lead Channel Setting Pacing Amplitude: 2 V
Lead Channel Setting Pacing Amplitude: 2.5 V
Lead Channel Setting Pacing Pulse Width: 0.4 ms
Lead Channel Setting Sensing Sensitivity: 0.9 mV

## 2021-12-13 NOTE — Progress Notes (Signed)
Remote pacemaker transmission.   

## 2021-12-13 NOTE — Patient Instructions (Addendum)
Medication Instructions:   Your physician recommends that you continue on your current medications as directed. Please refer to the Current Medication list given to you today.  *If you need a refill on your cardiac medications before your next appointment, please call your pharmacy*   Lab Work:  RETURN FASTING WHEN YOU CAN FRO  LIVER AND LIPID    If you have labs (blood work) drawn today and your tests are completely normal, you will receive your results only by: MyChart Message (if you have MyChart) OR A paper copy in the mail If you have any lab test that is abnormal or we need to change your treatment, we will call you to review the results.   Testing/Procedures: NONE ORDERED  TODAY     Follow-Up: At [facility], you and your health needs are our priority.  As part of our continuing mission to provide you with exceptional heart care, we have created designated Provider Care Teams.  These Care Teams include your primary Cardiologist (physician) and Advanced Practice Providers (APPs -  Physician Assistants and Nurse Practitioners) who all work together to provide you with the care you need, when you need it.  We recommend signing up for the patient portal called "MyChart".  Sign up information is provided on this After Visit Summary.  MyChart is used to connect with patients for Virtual Visits (Telemedicine).  Patients are able to view lab/test results, encounter notes, upcoming appointments, etc.  Non-urgent messages can be sent to your provider as well.   To learn more about what you can do with MyChart, go to [email or web address].    Your next appointment:   1 year(s)  The format for your next appointment:   In Person  Provider:   Steffanie Dunn, MD    Other Instructions   Important Information About Sugar

## 2021-12-20 ENCOUNTER — Ambulatory Visit: Payer: BC Managed Care – PPO | Attending: Physician Assistant

## 2022-02-14 ENCOUNTER — Ambulatory Visit (INDEPENDENT_AMBULATORY_CARE_PROVIDER_SITE_OTHER): Payer: BC Managed Care – PPO

## 2022-02-14 DIAGNOSIS — I495 Sick sinus syndrome: Secondary | ICD-10-CM | POA: Diagnosis not present

## 2022-02-16 LAB — CUP PACEART REMOTE DEVICE CHECK
Battery Remaining Longevity: 124 mo
Battery Voltage: 3.01 V
Brady Statistic AP VP Percent: 0.01 %
Brady Statistic AP VS Percent: 7.83 %
Brady Statistic AS VP Percent: 0.03 %
Brady Statistic AS VS Percent: 92.13 %
Brady Statistic RA Percent Paced: 7.85 %
Brady Statistic RV Percent Paced: 0.04 %
Date Time Interrogation Session: 20231025064228
Implantable Lead Connection Status: 753985
Implantable Lead Connection Status: 753985
Implantable Lead Implant Date: 20181018
Implantable Lead Implant Date: 20181018
Implantable Lead Location: 753859
Implantable Lead Location: 753860
Implantable Lead Model: 5076
Implantable Lead Model: 5076
Implantable Pulse Generator Implant Date: 20181018
Lead Channel Impedance Value: 323 Ohm
Lead Channel Impedance Value: 342 Ohm
Lead Channel Impedance Value: 418 Ohm
Lead Channel Impedance Value: 418 Ohm
Lead Channel Pacing Threshold Amplitude: 0.375 V
Lead Channel Pacing Threshold Amplitude: 1 V
Lead Channel Pacing Threshold Pulse Width: 0.4 ms
Lead Channel Pacing Threshold Pulse Width: 0.4 ms
Lead Channel Sensing Intrinsic Amplitude: 10 mV
Lead Channel Sensing Intrinsic Amplitude: 10 mV
Lead Channel Sensing Intrinsic Amplitude: 2.375 mV
Lead Channel Sensing Intrinsic Amplitude: 2.375 mV
Lead Channel Setting Pacing Amplitude: 2 V
Lead Channel Setting Pacing Amplitude: 2.5 V
Lead Channel Setting Pacing Pulse Width: 0.4 ms
Lead Channel Setting Sensing Sensitivity: 0.9 mV
Zone Setting Status: 755011
Zone Setting Status: 755011

## 2022-03-06 NOTE — Progress Notes (Signed)
Remote pacemaker transmission.   

## 2022-05-16 ENCOUNTER — Ambulatory Visit: Payer: BC Managed Care – PPO | Attending: Cardiology

## 2022-05-16 DIAGNOSIS — I495 Sick sinus syndrome: Secondary | ICD-10-CM | POA: Diagnosis not present

## 2022-05-16 LAB — CUP PACEART REMOTE DEVICE CHECK
Battery Remaining Longevity: 121 mo
Battery Voltage: 3.01 V
Brady Statistic AP VP Percent: 0.01 %
Brady Statistic AP VS Percent: 6.41 %
Brady Statistic AS VP Percent: 0.02 %
Brady Statistic AS VS Percent: 93.55 %
Brady Statistic RA Percent Paced: 6.42 %
Brady Statistic RV Percent Paced: 0.04 %
Date Time Interrogation Session: 20240123081422
Implantable Lead Connection Status: 753985
Implantable Lead Connection Status: 753985
Implantable Lead Implant Date: 20181018
Implantable Lead Implant Date: 20181018
Implantable Lead Location: 753859
Implantable Lead Location: 753860
Implantable Lead Model: 5076
Implantable Lead Model: 5076
Implantable Pulse Generator Implant Date: 20181018
Lead Channel Impedance Value: 323 Ohm
Lead Channel Impedance Value: 361 Ohm
Lead Channel Impedance Value: 418 Ohm
Lead Channel Impedance Value: 418 Ohm
Lead Channel Pacing Threshold Amplitude: 0.375 V
Lead Channel Pacing Threshold Amplitude: 1 V
Lead Channel Pacing Threshold Pulse Width: 0.4 ms
Lead Channel Pacing Threshold Pulse Width: 0.4 ms
Lead Channel Sensing Intrinsic Amplitude: 2.5 mV
Lead Channel Sensing Intrinsic Amplitude: 2.5 mV
Lead Channel Sensing Intrinsic Amplitude: 9.75 mV
Lead Channel Sensing Intrinsic Amplitude: 9.75 mV
Lead Channel Setting Pacing Amplitude: 2 V
Lead Channel Setting Pacing Amplitude: 2.5 V
Lead Channel Setting Pacing Pulse Width: 0.4 ms
Lead Channel Setting Sensing Sensitivity: 0.9 mV
Zone Setting Status: 755011
Zone Setting Status: 755011

## 2022-06-13 NOTE — Progress Notes (Signed)
Remote pacemaker transmission.   

## 2022-08-15 ENCOUNTER — Ambulatory Visit (INDEPENDENT_AMBULATORY_CARE_PROVIDER_SITE_OTHER): Payer: BC Managed Care – PPO

## 2022-08-15 DIAGNOSIS — I495 Sick sinus syndrome: Secondary | ICD-10-CM | POA: Diagnosis not present

## 2022-08-16 LAB — CUP PACEART REMOTE DEVICE CHECK
Battery Remaining Longevity: 118 mo
Battery Voltage: 3.01 V
Brady Statistic AP VP Percent: 0.01 %
Brady Statistic AP VS Percent: 7.25 %
Brady Statistic AS VP Percent: 0.02 %
Brady Statistic AS VS Percent: 92.71 %
Brady Statistic RA Percent Paced: 7.26 %
Brady Statistic RV Percent Paced: 0.04 %
Date Time Interrogation Session: 20240423031328
Implantable Lead Connection Status: 753985
Implantable Lead Connection Status: 753985
Implantable Lead Implant Date: 20181018
Implantable Lead Implant Date: 20181018
Implantable Lead Location: 753859
Implantable Lead Location: 753860
Implantable Lead Model: 5076
Implantable Lead Model: 5076
Implantable Pulse Generator Implant Date: 20181018
Lead Channel Impedance Value: 323 Ohm
Lead Channel Impedance Value: 342 Ohm
Lead Channel Impedance Value: 399 Ohm
Lead Channel Impedance Value: 418 Ohm
Lead Channel Pacing Threshold Amplitude: 0.375 V
Lead Channel Pacing Threshold Amplitude: 1 V
Lead Channel Pacing Threshold Pulse Width: 0.4 ms
Lead Channel Pacing Threshold Pulse Width: 0.4 ms
Lead Channel Sensing Intrinsic Amplitude: 2.5 mV
Lead Channel Sensing Intrinsic Amplitude: 2.5 mV
Lead Channel Sensing Intrinsic Amplitude: 9.25 mV
Lead Channel Sensing Intrinsic Amplitude: 9.25 mV
Lead Channel Setting Pacing Amplitude: 2 V
Lead Channel Setting Pacing Amplitude: 2.5 V
Lead Channel Setting Pacing Pulse Width: 0.4 ms
Lead Channel Setting Sensing Sensitivity: 0.9 mV
Zone Setting Status: 755011
Zone Setting Status: 755011

## 2022-09-13 NOTE — Progress Notes (Signed)
Remote pacemaker transmission.   

## 2022-11-14 ENCOUNTER — Ambulatory Visit (INDEPENDENT_AMBULATORY_CARE_PROVIDER_SITE_OTHER): Payer: BC Managed Care – PPO

## 2022-11-14 DIAGNOSIS — I495 Sick sinus syndrome: Secondary | ICD-10-CM

## 2022-11-16 LAB — CUP PACEART REMOTE DEVICE CHECK
Battery Remaining Longevity: 116 mo
Battery Voltage: 3.01 V
Brady Statistic AP VP Percent: 0.02 %
Brady Statistic AP VS Percent: 9.37 %
Brady Statistic AS VP Percent: 0.03 %
Brady Statistic AS VS Percent: 90.59 %
Brady Statistic RA Percent Paced: 9.39 %
Brady Statistic RV Percent Paced: 0.04 %
Implantable Lead Connection Status: 753985
Implantable Lead Connection Status: 753985
Implantable Lead Implant Date: 20181018
Implantable Lead Implant Date: 20181018
Implantable Lead Location: 753859
Implantable Lead Location: 753860
Implantable Lead Model: 5076
Implantable Lead Model: 5076
Implantable Pulse Generator Implant Date: 20181018
Lead Channel Impedance Value: 323 Ohm
Lead Channel Impedance Value: 361 Ohm
Lead Channel Impedance Value: 418 Ohm
Lead Channel Impedance Value: 437 Ohm
Lead Channel Pacing Threshold Amplitude: 0.375 V
Lead Channel Pacing Threshold Amplitude: 0.875 V
Lead Channel Pacing Threshold Pulse Width: 0.4 ms
Lead Channel Sensing Intrinsic Amplitude: 2.375 mV
Lead Channel Sensing Intrinsic Amplitude: 2.375 mV
Lead Channel Sensing Intrinsic Amplitude: 9.375 mV
Lead Channel Sensing Intrinsic Amplitude: 9.375 mV
Lead Channel Setting Pacing Amplitude: 2 V
Lead Channel Setting Pacing Amplitude: 2.5 V
Lead Channel Setting Pacing Pulse Width: 0.4 ms
Lead Channel Setting Sensing Sensitivity: 0.9 mV
Zone Setting Status: 755011

## 2022-12-01 NOTE — Progress Notes (Signed)
Remote pacemaker transmission.   

## 2023-02-13 ENCOUNTER — Ambulatory Visit (INDEPENDENT_AMBULATORY_CARE_PROVIDER_SITE_OTHER): Payer: BC Managed Care – PPO

## 2023-02-13 DIAGNOSIS — I495 Sick sinus syndrome: Secondary | ICD-10-CM

## 2023-02-15 LAB — CUP PACEART REMOTE DEVICE CHECK
Battery Remaining Longevity: 114 mo
Battery Voltage: 3.01 V
Brady Statistic AP VP Percent: 0.02 %
Brady Statistic AP VS Percent: 9.02 %
Brady Statistic AS VP Percent: 0.03 %
Brady Statistic AS VS Percent: 90.94 %
Brady Statistic RA Percent Paced: 9.04 %
Brady Statistic RV Percent Paced: 0.04 %
Date Time Interrogation Session: 20241021233343
Implantable Lead Connection Status: 753985
Implantable Lead Connection Status: 753985
Implantable Lead Implant Date: 20181018
Implantable Lead Implant Date: 20181018
Implantable Lead Location: 753859
Implantable Lead Location: 753860
Implantable Lead Model: 5076
Implantable Lead Model: 5076
Implantable Pulse Generator Implant Date: 20181018
Lead Channel Impedance Value: 323 Ohm
Lead Channel Impedance Value: 342 Ohm
Lead Channel Impedance Value: 380 Ohm
Lead Channel Impedance Value: 437 Ohm
Lead Channel Pacing Threshold Amplitude: 0.375 V
Lead Channel Pacing Threshold Amplitude: 1 V
Lead Channel Pacing Threshold Pulse Width: 0.4 ms
Lead Channel Pacing Threshold Pulse Width: 0.4 ms
Lead Channel Sensing Intrinsic Amplitude: 3 mV
Lead Channel Sensing Intrinsic Amplitude: 3 mV
Lead Channel Sensing Intrinsic Amplitude: 9.625 mV
Lead Channel Sensing Intrinsic Amplitude: 9.625 mV
Lead Channel Setting Pacing Amplitude: 2 V
Lead Channel Setting Pacing Amplitude: 2.5 V
Lead Channel Setting Pacing Pulse Width: 0.4 ms
Lead Channel Setting Sensing Sensitivity: 0.9 mV
Zone Setting Status: 755011
Zone Setting Status: 755011

## 2023-03-02 NOTE — Progress Notes (Signed)
Remote pacemaker transmission.   

## 2023-05-15 ENCOUNTER — Ambulatory Visit (INDEPENDENT_AMBULATORY_CARE_PROVIDER_SITE_OTHER): Payer: BC Managed Care – PPO

## 2023-05-15 DIAGNOSIS — I495 Sick sinus syndrome: Secondary | ICD-10-CM | POA: Diagnosis not present

## 2023-05-16 ENCOUNTER — Encounter: Payer: Self-pay | Admitting: Cardiology

## 2023-05-16 LAB — CUP PACEART REMOTE DEVICE CHECK
Battery Remaining Longevity: 113 mo
Battery Voltage: 3.01 V
Brady Statistic AP VP Percent: 0.01 %
Brady Statistic AP VS Percent: 6.56 %
Brady Statistic AS VP Percent: 0.02 %
Brady Statistic AS VS Percent: 93.4 %
Brady Statistic RA Percent Paced: 6.57 %
Brady Statistic RV Percent Paced: 0.04 %
Date Time Interrogation Session: 20250120222105
Implantable Lead Connection Status: 753985
Implantable Lead Connection Status: 753985
Implantable Lead Implant Date: 20181018
Implantable Lead Implant Date: 20181018
Implantable Lead Location: 753859
Implantable Lead Location: 753860
Implantable Lead Model: 5076
Implantable Lead Model: 5076
Implantable Pulse Generator Implant Date: 20181018
Lead Channel Impedance Value: 323 Ohm
Lead Channel Impedance Value: 342 Ohm
Lead Channel Impedance Value: 399 Ohm
Lead Channel Impedance Value: 418 Ohm
Lead Channel Pacing Threshold Amplitude: 0.5 V
Lead Channel Pacing Threshold Amplitude: 1 V
Lead Channel Pacing Threshold Pulse Width: 0.4 ms
Lead Channel Pacing Threshold Pulse Width: 0.4 ms
Lead Channel Sensing Intrinsic Amplitude: 2.25 mV
Lead Channel Sensing Intrinsic Amplitude: 2.25 mV
Lead Channel Sensing Intrinsic Amplitude: 9.5 mV
Lead Channel Sensing Intrinsic Amplitude: 9.5 mV
Lead Channel Setting Pacing Amplitude: 2 V
Lead Channel Setting Pacing Amplitude: 2.5 V
Lead Channel Setting Pacing Pulse Width: 0.4 ms
Lead Channel Setting Sensing Sensitivity: 0.9 mV
Zone Setting Status: 755011
Zone Setting Status: 755011

## 2023-06-27 NOTE — Addendum Note (Signed)
 Addended by: Geralyn Flash D on: 06/27/2023 09:48 AM   Modules accepted: Orders

## 2023-06-27 NOTE — Progress Notes (Signed)
 Remote pacemaker transmission.

## 2023-08-14 ENCOUNTER — Encounter: Payer: Self-pay | Admitting: Cardiology

## 2023-08-14 ENCOUNTER — Ambulatory Visit: Payer: BC Managed Care – PPO

## 2023-08-14 DIAGNOSIS — I495 Sick sinus syndrome: Secondary | ICD-10-CM

## 2023-08-14 LAB — CUP PACEART REMOTE DEVICE CHECK
Battery Remaining Longevity: 112 mo
Battery Voltage: 3 V
Brady Statistic AP VP Percent: 0.01 %
Brady Statistic AP VS Percent: 6.36 %
Brady Statistic AS VP Percent: 0.02 %
Brady Statistic AS VS Percent: 93.6 %
Brady Statistic RA Percent Paced: 6.37 %
Brady Statistic RV Percent Paced: 0.04 %
Date Time Interrogation Session: 20250421214506
Implantable Lead Connection Status: 753985
Implantable Lead Connection Status: 753985
Implantable Lead Implant Date: 20181018
Implantable Lead Implant Date: 20181018
Implantable Lead Location: 753859
Implantable Lead Location: 753860
Implantable Lead Model: 5076
Implantable Lead Model: 5076
Implantable Pulse Generator Implant Date: 20181018
Lead Channel Impedance Value: 323 Ohm
Lead Channel Impedance Value: 361 Ohm
Lead Channel Impedance Value: 418 Ohm
Lead Channel Impedance Value: 437 Ohm
Lead Channel Pacing Threshold Amplitude: 0.375 V
Lead Channel Pacing Threshold Amplitude: 1 V
Lead Channel Pacing Threshold Pulse Width: 0.4 ms
Lead Channel Pacing Threshold Pulse Width: 0.4 ms
Lead Channel Sensing Intrinsic Amplitude: 2.25 mV
Lead Channel Sensing Intrinsic Amplitude: 2.25 mV
Lead Channel Sensing Intrinsic Amplitude: 6.875 mV
Lead Channel Sensing Intrinsic Amplitude: 6.875 mV
Lead Channel Setting Pacing Amplitude: 2 V
Lead Channel Setting Pacing Amplitude: 2.5 V
Lead Channel Setting Pacing Pulse Width: 0.4 ms
Lead Channel Setting Sensing Sensitivity: 0.9 mV
Zone Setting Status: 755011
Zone Setting Status: 755011

## 2023-09-27 NOTE — Progress Notes (Signed)
 Remote pacemaker transmission.

## 2023-11-13 ENCOUNTER — Ambulatory Visit: Payer: BC Managed Care – PPO

## 2023-11-13 DIAGNOSIS — I495 Sick sinus syndrome: Secondary | ICD-10-CM | POA: Diagnosis not present

## 2023-11-14 LAB — CUP PACEART REMOTE DEVICE CHECK
Battery Remaining Longevity: 110 mo
Battery Voltage: 3 V
Brady Statistic AP VP Percent: 0.01 %
Brady Statistic AP VS Percent: 6.84 %
Brady Statistic AS VP Percent: 0.03 %
Brady Statistic AS VS Percent: 93.12 %
Brady Statistic RA Percent Paced: 6.86 %
Brady Statistic RV Percent Paced: 0.04 %
Date Time Interrogation Session: 20250721212058
Implantable Lead Connection Status: 753985
Implantable Lead Connection Status: 753985
Implantable Lead Implant Date: 20181018
Implantable Lead Implant Date: 20181018
Implantable Lead Location: 753859
Implantable Lead Location: 753860
Implantable Lead Model: 5076
Implantable Lead Model: 5076
Implantable Pulse Generator Implant Date: 20181018
Lead Channel Impedance Value: 323 Ohm
Lead Channel Impedance Value: 342 Ohm
Lead Channel Impedance Value: 380 Ohm
Lead Channel Impedance Value: 418 Ohm
Lead Channel Pacing Threshold Amplitude: 0.375 V
Lead Channel Pacing Threshold Amplitude: 1 V
Lead Channel Pacing Threshold Pulse Width: 0.4 ms
Lead Channel Pacing Threshold Pulse Width: 0.4 ms
Lead Channel Sensing Intrinsic Amplitude: 2.375 mV
Lead Channel Sensing Intrinsic Amplitude: 2.375 mV
Lead Channel Sensing Intrinsic Amplitude: 9.375 mV
Lead Channel Sensing Intrinsic Amplitude: 9.375 mV
Lead Channel Setting Pacing Amplitude: 2 V
Lead Channel Setting Pacing Amplitude: 2.5 V
Lead Channel Setting Pacing Pulse Width: 0.4 ms
Lead Channel Setting Sensing Sensitivity: 0.9 mV
Zone Setting Status: 755011
Zone Setting Status: 755011

## 2023-11-15 ENCOUNTER — Ambulatory Visit: Payer: Self-pay | Admitting: Cardiology

## 2024-01-25 NOTE — Progress Notes (Signed)
 Remote PPM Transmission

## 2024-02-12 ENCOUNTER — Ambulatory Visit: Payer: BC Managed Care – PPO | Attending: Internal Medicine

## 2024-02-12 DIAGNOSIS — I495 Sick sinus syndrome: Secondary | ICD-10-CM

## 2024-02-14 LAB — CUP PACEART REMOTE DEVICE CHECK
Battery Remaining Longevity: 109 mo
Battery Voltage: 3 V
Brady Statistic AP VP Percent: 0.02 %
Brady Statistic AP VS Percent: 8.37 %
Brady Statistic AS VP Percent: 0.03 %
Brady Statistic AS VS Percent: 91.59 %
Brady Statistic RA Percent Paced: 8.39 %
Brady Statistic RV Percent Paced: 0.04 %
Date Time Interrogation Session: 20251021002050
Implantable Lead Connection Status: 753985
Implantable Lead Connection Status: 753985
Implantable Lead Implant Date: 20181018
Implantable Lead Implant Date: 20181018
Implantable Lead Location: 753859
Implantable Lead Location: 753860
Implantable Lead Model: 5076
Implantable Lead Model: 5076
Implantable Pulse Generator Implant Date: 20181018
Lead Channel Impedance Value: 323 Ohm
Lead Channel Impedance Value: 342 Ohm
Lead Channel Impedance Value: 380 Ohm
Lead Channel Impedance Value: 418 Ohm
Lead Channel Pacing Threshold Amplitude: 0.375 V
Lead Channel Pacing Threshold Amplitude: 1 V
Lead Channel Pacing Threshold Pulse Width: 0.4 ms
Lead Channel Pacing Threshold Pulse Width: 0.4 ms
Lead Channel Sensing Intrinsic Amplitude: 2.25 mV
Lead Channel Sensing Intrinsic Amplitude: 2.25 mV
Lead Channel Sensing Intrinsic Amplitude: 5.25 mV
Lead Channel Sensing Intrinsic Amplitude: 5.25 mV
Lead Channel Setting Pacing Amplitude: 2 V
Lead Channel Setting Pacing Amplitude: 2.5 V
Lead Channel Setting Pacing Pulse Width: 0.4 ms
Lead Channel Setting Sensing Sensitivity: 0.9 mV
Zone Setting Status: 755011
Zone Setting Status: 755011

## 2024-02-15 NOTE — Progress Notes (Signed)
 Remote PPM Transmission

## 2024-02-18 ENCOUNTER — Ambulatory Visit: Payer: Self-pay | Admitting: Cardiology

## 2024-05-13 ENCOUNTER — Ambulatory Visit: Payer: BC Managed Care – PPO

## 2024-05-13 DIAGNOSIS — I495 Sick sinus syndrome: Secondary | ICD-10-CM | POA: Diagnosis not present

## 2024-05-15 LAB — CUP PACEART REMOTE DEVICE CHECK
Battery Remaining Longevity: 108 mo
Battery Voltage: 3 V
Brady Statistic AP VP Percent: 0.01 %
Brady Statistic AP VS Percent: 7.45 %
Brady Statistic AS VP Percent: 0.03 %
Brady Statistic AS VS Percent: 92.51 %
Brady Statistic RA Percent Paced: 7.46 %
Brady Statistic RV Percent Paced: 0.04 %
Date Time Interrogation Session: 20260119193259
Implantable Lead Connection Status: 753985
Implantable Lead Connection Status: 753985
Implantable Lead Implant Date: 20181018
Implantable Lead Implant Date: 20181018
Implantable Lead Location: 753859
Implantable Lead Location: 753860
Implantable Lead Model: 5076
Implantable Lead Model: 5076
Implantable Pulse Generator Implant Date: 20181018
Lead Channel Impedance Value: 323 Ohm
Lead Channel Impedance Value: 361 Ohm
Lead Channel Impedance Value: 380 Ohm
Lead Channel Impedance Value: 437 Ohm
Lead Channel Pacing Threshold Amplitude: 0.375 V
Lead Channel Pacing Threshold Amplitude: 1 V
Lead Channel Pacing Threshold Pulse Width: 0.4 ms
Lead Channel Pacing Threshold Pulse Width: 0.4 ms
Lead Channel Sensing Intrinsic Amplitude: 2.5 mV
Lead Channel Sensing Intrinsic Amplitude: 2.5 mV
Lead Channel Sensing Intrinsic Amplitude: 7.375 mV
Lead Channel Sensing Intrinsic Amplitude: 7.375 mV
Lead Channel Setting Pacing Amplitude: 2 V
Lead Channel Setting Pacing Amplitude: 2.5 V
Lead Channel Setting Pacing Pulse Width: 0.4 ms
Lead Channel Setting Sensing Sensitivity: 0.9 mV
Zone Setting Status: 755011
Zone Setting Status: 755011

## 2024-05-16 NOTE — Progress Notes (Signed)
 Remote PPM Transmission

## 2024-05-19 ENCOUNTER — Ambulatory Visit: Payer: Self-pay | Admitting: Cardiology

## 2024-08-12 ENCOUNTER — Ambulatory Visit: Payer: BC Managed Care – PPO
